# Patient Record
Sex: Female | Born: 1990 | Race: White | Hispanic: No | Marital: Married | Smoking: Never smoker
Health system: Southern US, Community
[De-identification: ages and names within clinical notes are randomized; demographics above are authoritative.]

## PROBLEM LIST (undated history)

## (undated) DIAGNOSIS — R011 Cardiac murmur, unspecified: Secondary | ICD-10-CM

## (undated) DIAGNOSIS — D649 Anemia, unspecified: Secondary | ICD-10-CM

## (undated) DIAGNOSIS — J45909 Unspecified asthma, uncomplicated: Secondary | ICD-10-CM

## (undated) DIAGNOSIS — O09891 Supervision of other high risk pregnancies, first trimester: Secondary | ICD-10-CM

## (undated) DIAGNOSIS — O0990 Supervision of high risk pregnancy, unspecified, unspecified trimester: Secondary | ICD-10-CM

## (undated) DIAGNOSIS — D509 Iron deficiency anemia, unspecified: Secondary | ICD-10-CM

## (undated) HISTORY — DX: Supervision of high risk pregnancy, unspecified, unspecified trimester: O09.90

## (undated) HISTORY — DX: Iron deficiency anemia, unspecified: D50.9

## (undated) HISTORY — PX: OTHER SURGICAL HISTORY: SHX169

## (undated) HISTORY — DX: Supervision of other high risk pregnancies, first trimester: O09.891

---
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
## 2015-10-01 LAB — HM PAP SMEAR: HM Pap smear: NEGATIVE

## 2016-04-07 ENCOUNTER — Emergency Department: Payer: Self-pay

## 2016-04-07 ENCOUNTER — Encounter: Payer: Self-pay | Admitting: *Deleted

## 2016-04-07 ENCOUNTER — Emergency Department
Admission: EM | Admit: 2016-04-07 | Discharge: 2016-04-08 | Disposition: A | Discharge status: Home / Self Care | Payer: Self-pay | Attending: Emergency Medicine | Admitting: Emergency Medicine

## 2016-04-07 DIAGNOSIS — N760 Acute vaginitis: Secondary | ICD-10-CM | POA: Insufficient documentation

## 2016-04-07 DIAGNOSIS — Z30431 Encounter for routine checking of intrauterine contraceptive device: Secondary | ICD-10-CM

## 2016-04-07 DIAGNOSIS — B9689 Other specified bacterial agents as the cause of diseases classified elsewhere: Secondary | ICD-10-CM

## 2016-04-07 LAB — POCT PREGNANCY, URINE: Preg Test, Ur: NEGATIVE

## 2016-04-07 LAB — URINALYSIS COMPLETE WITH MICROSCOPIC (ARMC ONLY)
BACTERIA UA: NONE SEEN
Bilirubin Urine: NEGATIVE
Glucose, UA: NEGATIVE mg/dL
Ketones, ur: NEGATIVE mg/dL
Nitrite: NEGATIVE
PH: 5 (ref 5.0–8.0)
PROTEIN: NEGATIVE mg/dL
Specific Gravity, Urine: 1.015 (ref 1.005–1.030)

## 2016-04-07 LAB — CHLAMYDIA/NGC RT PCR (ARMC ONLY)
Chlamydia Tr: NOT DETECTED
N gonorrhoeae: NOT DETECTED

## 2016-04-07 LAB — WET PREP, GENITAL
Sperm: NONE SEEN
TRICH WET PREP: NONE SEEN
WBC, Wet Prep HPF POC: NONE SEEN
Yeast Wet Prep HPF POC: NONE SEEN

## 2016-04-07 MED ORDER — METRONIDAZOLE 500 MG PO TABS: 500.0000 mg | ORAL_TABLET | Freq: Three times a day (TID) | ORAL | Status: DC

## 2016-04-07 NOTE — Discharge Instructions (Signed)
You were seen in the emergency room for pelvic discomfort. Your IUD strings appear in place today. It is important that you follow up closely with the health department in the next couple of days.  Please return to the emergency room right away if you are to develop a fever, severe nausea, your pain becomes severe or worsens, you are unable to keep food down, begin vomiting any dark or bloody fluid, you develop any dark or bloody stools, feel dehydrated, or other new concerns or symptoms arise.   Bacterial Vaginosis Bacterial vaginosis is a vaginal infection that occurs when the normal balance of bacteria in the vagina is disrupted. It results from an overgrowth of certain bacteria. This is the most common vaginal infection in women of childbearing age. Treatment is important to prevent complications, especially in pregnant women, as it can cause a premature delivery. CAUSES  Bacterial vaginosis is caused by an increase in harmful bacteria that are normally present in smaller amounts in the vagina. Several different kinds of bacteria can cause bacterial vaginosis. However, the reason that the condition develops is not fully understood. RISK FACTORS Certain activities or behaviors can put you at an increased risk of developing bacterial vaginosis, including:  Having a new sex partner or multiple sex partners.  Douching.  Using an intrauterine device (IUD) for contraception. Women do not get bacterial vaginosis from toilet seats, bedding, swimming pools, or contact with objects around them. SIGNS AND SYMPTOMS  Some women with bacterial vaginosis have no signs or symptoms. Common symptoms include:  Grey vaginal discharge.  A fishlike odor with discharge, especially after sexual intercourse.  Itching or burning of the vagina and vulva.  Burning or pain with urination. DIAGNOSIS  Your health care provider will take a medical history and examine the vagina for signs of bacterial vaginosis. A  sample of vaginal fluid may be taken. Your health care provider will look at this sample under a microscope to check for bacteria and abnormal cells. A vaginal pH test may also be done.  TREATMENT  Bacterial vaginosis may be treated with antibiotic medicines. These may be given in the form of a pill or a vaginal cream. A second round of antibiotics may be prescribed if the condition comes back after treatment. Because bacterial vaginosis increases your risk for sexually transmitted diseases, getting treated can help reduce your risk for chlamydia, gonorrhea, HIV, and herpes. HOME CARE INSTRUCTIONS   Only take over-the-counter or prescription medicines as directed by your health care provider.  If antibiotic medicine was prescribed, take it as directed. Make sure you finish it even if you start to feel better.  Tell all sexual partners that you have a vaginal infection. They should see their health care provider and be treated if they have problems, such as a mild rash or itching.  During treatment, it is important that you follow these instructions:  Avoid sexual activity or use condoms correctly.  Do not douche.  Avoid alcohol as directed by your health care provider.  Avoid breastfeeding as directed by your health care provider. SEEK MEDICAL CARE IF:   Your symptoms are not improving after 3 days of treatment.  You have increased discharge or pain.  You have a fever. MAKE SURE YOU:   Understand these instructions.  Will watch your condition.  Will get help right away if you are not doing well or get worse. FOR MORE INFORMATION  Centers for Disease Control and Prevention, Division of STD Prevention: SolutionApps.co.zawww.cdc.gov/std American Sexual  Health Association (ASHA): www.ashastd.org    This information is not intended to replace advice given to you by your health care provider. Make sure you discuss any questions you have with your health care provider.   Document Released: 11/24/2005  Document Revised: 12/15/2014 Document Reviewed: 07/06/2013 Elsevier Interactive Patient Education 2016 [employer].  Intrauterine Device Information An intrauterine device (IUD) is inserted into your uterus to prevent pregnancy. There are two types of IUDs available:   Copper IUD--This type of IUD is wrapped in copper wire and is placed inside the uterus. Copper makes the uterus and fallopian tubes produce a fluid that kills sperm. The copper IUD can stay in place for 10 years.  Hormone IUD--This type of IUD contains the hormone progestin (synthetic progesterone). The hormone thickens the cervical mucus and prevents sperm from entering the uterus. It also thins the uterine lining to prevent implantation of a fertilized egg. The hormone can weaken or kill the sperm that get into the uterus. One type of hormone IUD can stay in place for 5 years, and another type can stay in place for 3 years. Your health care provider will make sure you are a good candidate for a contraceptive IUD. Discuss with your health care provider the possible side effects.  ADVANTAGES OF AN INTRAUTERINE DEVICE  IUDs are highly effective, reversible, long acting, and low maintenance.   There are no estrogen-related side effects.   An IUD can be used when breastfeeding.   IUDs are not associated with weight gain.   The copper IUD works immediately after insertion.   The hormone IUD works right away if inserted within 7 days of your period starting. You will need to use a backup method of birth control for 7 days if the hormone IUD is inserted at any other time in your cycle.  The copper IUD does not interfere with your female hormones.   The hormone IUD can make heavy menstrual periods lighter and decrease cramping.   The hormone IUD can be used for 3 or 5 years.   The copper IUD can be used for 10 years. DISADVANTAGES OF AN INTRAUTERINE DEVICE  The hormone IUD can be associated with irregular bleeding  patterns.   The copper IUD can make your menstrual flow heavier and more painful.   You may experience cramping and vaginal bleeding after insertion.    This information is not intended to replace advice given to you by your health care provider. Make sure you discuss any questions you have with your health care provider.   Document Released: 10/28/2004 Document Revised: 07/27/2013 Document Reviewed: 05/15/2013 Elsevier Interactive Patient Education [employer]

## 2016-04-07 NOTE — ED Notes (Signed)
Pt states IUD strings are longer, noticed yesterday morning.. Pt states she has been having a lot of lower abdominal pain x 1 weeks. Denies vaginal bleeding.

## 2016-04-07 NOTE — ED Provider Notes (Signed)
[facility] Emergency Department Provider Note  ____________________________________________  Time seen: Approximately 9:32 PM  I have reviewed the triage vital signs and the nursing notes.   HISTORY  Chief Complaint Abdominal Pain    HPI Lori Velasquez is a 25 y.o. female reports no significant medical history, no previous pregnancies but does have an IUD that was placed roughly a few months ago at the health department.  Patient reports that for the last few days she's been experiencing a slight uncomfortable feeling in the vaginal area, and when she had her monthly check on her IUD she believes the strings have pulled out further than normal. She is not having any vaginal discharge or bleeding, fevers chills or abdominal pain. She reports a light discomfort in the center of her mid lower pelvis".  Scrubs a very mild discomfort. Somewhat hard to describe located right in the area of her vagina.   No past medical history on file.  There are no active problems to display for this patient.   No past surgical history on file.  Current Outpatient Rx  Name  Route  Sig  Dispense  Refill  . metroNIDAZOLE (FLAGYL) 500 MG tablet   Oral   Take 1 tablet (500 mg total) by mouth 3 (three) times daily.   30 tablet   0     Allergies Review of patient's allergies indicates no known allergies.  No family history on file.  Social History Social History  Substance Use Topics  . Smoking status: Never Smoker   . Smokeless tobacco: Not on file  . Alcohol Use: No    Review of Systems Constitutional: No fever/chills Eyes: No visual changes. ENT: No sore throat. Cardiovascular: Denies chest pain. Respiratory: Denies shortness of breath. Gastrointestinal: No abdominal pain.  No nausea, no vomiting.  No diarrhea.  No constipation. Genitourinary: Negative for dysuria. No trouble urinating. Musculoskeletal: Negative for back pain. Skin: Negative for  rash. Neurological: Negative for headaches, focal weakness or numbness.  10-point ROS otherwise negative.  ____________________________________________   PHYSICAL EXAM:  VITAL SIGNS: ED Triage Vitals  Enc Vitals Group     BP 04/07/16 2041 112/70 mmHg     Pulse Rate 04/07/16 2041 77     Resp --      Temp 04/07/16 2041 98.1 F (36.7 C)     Temp Source 04/07/16 2041 Oral     SpO2 04/07/16 2041 100 %     Weight 04/07/16 2041 152 lb (68.947 kg)     Height 04/07/16 2041  (1.499 m)     Head Cir --      Peak Flow --      Pain Score 04/07/16 2045 8     Pain Loc --      Pain Edu? --      Excl. in GC? --    Constitutional: Alert and oriented. Well appearing and in no acute distress. Eyes: Conjunctivae are normal. PERRL. EOMI. Head: Atraumatic. Nose: No congestion/rhinnorhea. Mouth/Throat: Mucous membranes are moist.  Oropharynx non-erythematous. Neck: No stridor.   Cardiovascular: Normal rate, regular rhythm. Grossly normal heart sounds.  Good peripheral circulation. Respiratory: Normal respiratory effort.  No retractions. Lungs CTAB. Gastrointestinal: Soft and nontender. No distention. No tenderness at McBurney's point. No CVA tenderness. Genitourinary:  Escorted by nurse Florentina Addison. Normal external exam. Speculum exam with IUD strings noted within the cervix, no evidence of protrusion of the device, bleeding, or other complication. There is no cervical motion tenderness. Minimal and  questionable erythema of the vaginal mucosa. Slight abnormal fishy odor is noted. No adnexal tenderness. Musculoskeletal: No lower extremity tenderness nor edema.   Neurologic:  Normal speech and language. No gross focal neurologic deficits are appreciated. Skin:  Skin is warm, dry and intact. No rash noted. Psychiatric: Mood and affect are normal. Speech and behavior are normal.  ____________________________________________   LABS (all labs ordered are listed, but only abnormal results are  displayed)  Labs Reviewed  WET PREP, GENITAL - Abnormal; Notable for the following:    Clue Cells Wet Prep HPF POC PRESENT (*)    All other components within normal limits  URINALYSIS COMPLETEWITH MICROSCOPIC ([facility] ONLY) - Abnormal; Notable for the following:    Color, Urine YELLOW (*)    APPearance HAZY (*)    Hgb urine dipstick 2+ (*)    Leukocytes, UA TRACE (*)    Squamous Epithelial / LPF 0-5 (*)    All other components within normal limits  CHLAMYDIA/NGC RT PCR ([facility] ONLY)  POCT PREGNANCY, URINE   ____________________________________________  EKG   ____________________________________________  RADIOLOGY  Ultrasound pelvis pending at time of sign out ____________________________________________   PROCEDURES  Procedure(s) performed: None  Critical Care performed: No  ____________________________________________   INITIAL IMPRESSION / ASSESSMENT AND PLAN / ED COURSE  Pertinent labs & imaging results that were available during my care of the patient were reviewed by me and considered in my medical decision making (see chart for details).  Patient presents for concerns she is having discomfort to her IUD. She thinks the strings may have pulled out slightly. She denies any abdominal pain, is afebrile, and has very reassuring abdominal exam without discomfort. She does note a somewhat difficult to describe mid pelvic pain that feels like some uncomfortableness around her IUD. She denies any infectious symptoms or discharge.  Pelvic exam demonstrates IUD that appears to be in good position, no cervical motion tenderness, but a slight abnormal odor is noted. No adnexal discomfort.  Based on her complaint of lower abdominal discomfort, wet prep, gonorrhea chlamydia, and also ultrasound of the pelvis to evaluate for possible cystic or gynecologic process including IUD movement was ordered.  Ongoing care including follow-up pelvic ultrasound assigned to Dr.  Zenda AlpersWebster at 11:10 PM. Patient does appear to likely have bacterial vaginosis by exam, and laboratory testing. Provide prescription for Flagyl, and anticipate close follow-up with the health department was careful pelvic/gynecologic return precautions if ultrasound does not demonstrate acute concerning abnormality.   ____________________________________________   FINAL CLINICAL IMPRESSION(S) / ED DIAGNOSES  Final diagnoses:  BV (bacterial vaginosis)  Intrauterine contraceptive device, checking      Sharyn CreamerMark Elson Ulbrich, MD 04/07/16 2326

## 2016-04-07 NOTE — ED Notes (Signed)
Pt returned from U/S via stretcher. 

## 2016-04-07 NOTE — ED Notes (Signed)
Pt reports low abd pain.  Pt states her IUD strings are longer and that her IUD may be coming out.  No vag bleeding or dysuria.  No back pain.  No n/v/d.

## 2016-04-08 DIAGNOSIS — E669 Obesity, unspecified: Secondary | ICD-10-CM | POA: Insufficient documentation

## 2016-04-08 NOTE — ED Notes (Signed)
E-signature not working, Designer, jewelleryt signed on [employer]--signature page that was printed out by this [employer]ity fundraiser

## 2016-04-08 NOTE — ED Provider Notes (Signed)
-----------------------------------------   1:02 AM on 04/08/2016 -----------------------------------------   Blood pressure 114/72, pulse 87, temperature 98.1 F (36.7 C), temperature source Oral, resp. rate 16, height 4\' 11"  (1.499 m), weight 152 lb (68.947 kg), last menstrual period 04/04/2016, SpO2 96 %.  Assuming care from Dr. Fanny BienQuale.  In short, Lori Velasquez is a 25 y.o. female with a chief complaint of Abdominal Pain .  Refer to the original H&P for additional details.  The current plan of care is to follow up the results of the ultrasound and disposition the patient.   Pelvic ultrasound: No explanation for patient's vaginal discomfort specifically the patient's intrauterine device appears appropriately positioned within the endometrial canal, incidentally noted approximately 1.5 cm physiologic left-sided adnexal cyst.  I discussed the results with the patient at this time she has no further questions or concerns. She'll be discharged home to follow-up with the health department.  Rebecka ApleyAllison P Webster, MD 04/08/16 [phone]

## 2016-07-09 DIAGNOSIS — Z87798 Personal history of other (corrected) congenital malformations: Secondary | ICD-10-CM | POA: Insufficient documentation

## 2016-08-13 DIAGNOSIS — IMO0002 Reserved for concepts with insufficient information to code with codable children: Secondary | ICD-10-CM | POA: Insufficient documentation

## 2016-12-11 LAB — HM HIV SCREENING LAB: HM HIV Screening: NEGATIVE

## 2017-08-06 NOTE — Progress Notes (Signed)
08/07/2017   Chief Complaint: Desires prenatal care  Transfer of Care Patient: no  History of Present Illness: Lori Velasquez is a 26 y.o. G2P1001 presenting for prenatal care. Patient's last menstrual period was 06/26/2017.  Estimated Date of Delivery: 04/02/2018.  Positive urine pregnancy test 8/20 and confirmation at health department.  Her periods were: only one menses postpartum She was using no method when she conceived.  She has Positive signs or symptoms of breast tenderness and nausea of pregnancy. She has Negative signs or symptoms of miscarriage or preterm labor She identifies Negative Zika risk factors for her and her partner On any different medications around the time she conceived/early pregnancy: No  History of varicella: No   ROS: A 12-point review of systems was performed and negative, except as stated in the above HPI.  OBGYN History: As per HPI. OB History  Gravida Para Term Preterm AB Living  2 1 1     1   SAB TAB Ectopic Multiple Live Births               # Outcome Date GA Lbr Len/2nd Weight Sex Delivery Anes PTL Lv  2 Current           1 Term 03/10/17    M CS-Unspec         Any issues with any prior pregnancies: no Any prior children are healthy, doing well, without any problems or issues: yes History of pap smears: Yes. Last pap smear 2016. Abnormal: No  History of STIs: Yes, chlamydia in 2006   Past Medical History: No past medical history on file.  Past Surgical History: Past Surgical History:  Procedure Laterality Date  . CESAREAN SECTION      Family History:  No family history on file.  Patient is a twin. She denies any female cancers, bleeding or blood clotting disorders.  She denies any history of intellectual disability, birth defects or genetic disorders in her or the FOB's history  Social History:  Social History   Social History  . Marital status: Single    Spouse name: N/A  . Number of children: N/A  . Years of education: N/A     Occupational History  . Not on file.   Social History Main Topics  . Smoking status: Never Smoker  . Smokeless tobacco: Never Used  . Alcohol use No  . Drug use: No  . Sexual activity: Yes    Birth control/ protection: None   Other Topics Concern  . Not on file   Social History Narrative  . No narrative on file   Any pets in the household: no  Allergy: Allergies  Allergen Reactions  . Peppermint Oil   . Diphenhydramine Hcl     Told she had some reaction as a young child    Current Outpatient Medications: No current outpatient prescriptions on file.   Physical Exam:   BP 120/80   Wt 157 lb (71.2 kg)   LMP 06/26/2017 (LMP Unknown)   BMI 31.71 kg/m  Body mass index is 31.71 kg/m. Constitutional: Well nourished, well developed female in no acute distress.  Neck:  Supple, normal appearance, and no thyromegaly  Cardiovascular: S1, S2 normal, no murmur, rub or gallop, regular rate and rhythm Respiratory:  Clear to auscultation bilateral. Normal respiratory effort Abdomen: positive bowel sounds and no masses, hernias; diffusely non tender to palpation, non distended Breasts: breasts appear normal, no suspicious masses, no skin or nipple changes or axillary nodes. Neuro/Psych:  Normal mood  and affect.  Skin:  Warm and dry.  Lymphatic:  No inguinal lymphadenopathy.   Pelvic exam: is limited by body habitus External genitalia, Bartholin's glands, Urethra, Skene's glands: within normal limits Vagina: within normal limits and with no blood in the vault  Cervix: normal  Uterus:  Unable to palpate fundus on bimanual exam Adnexa:  negative for mass and tenderness  Assessment: Lori Velasquez is a 26 y.o. G2P1001 with an Estimated Date of Delivery: 04/02/2018, presenting for prenatal care.  Plan:  1) Avoid alcoholic beverages. 2) Patient encouraged not to smoke.  3) Discontinue the use of all non-medicinal drugs and chemicals.  4) Take prenatal vitamins daily.  5)  Seatbelt use advised. 6) Nutrition, food safety (fish, cheese advisories, and high nitrite foods) and exercise discussed. 7) Hospital and practice style delivering at [facility] discussed  8) Patient is asked about travel to areas at risk for the Zika virus, and counseled to avoid travel and exposure to mosquitoes or sexual partners who may have themselves been exposed to the virus.  9) Childbirth classes at [facility] advised 10) Genetic Screening, such as with 1st Trimester Screening, cell free fetal DNA, AFP testing, and Ultrasound, as well as with amniocentesis and CVS as appropriate, is discussed with patient. She plans to decline genetic testing this pregnancy.  Problem list reviewed and updated.  ROB and ultrasound in 4 weeks.  Lori Velasquez, CNM [facility], [facility] 08/07/2017  4:39 PM

## 2017-08-07 ENCOUNTER — Encounter: Payer: Self-pay | Admitting: Maternal Newborn

## 2017-08-07 ENCOUNTER — Ambulatory Visit (INDEPENDENT_AMBULATORY_CARE_PROVIDER_SITE_OTHER): Payer: Medicaid Other | Admitting: Maternal Newborn

## 2017-08-07 VITALS — BP 120/80 | Wt 157.0 lb

## 2017-08-07 DIAGNOSIS — O34219 Maternal care for unspecified type scar from previous cesarean delivery: Secondary | ICD-10-CM

## 2017-08-07 DIAGNOSIS — O0990 Supervision of high risk pregnancy, unspecified, unspecified trimester: Secondary | ICD-10-CM | POA: Insufficient documentation

## 2017-08-07 DIAGNOSIS — Z3481 Encounter for supervision of other normal pregnancy, first trimester: Secondary | ICD-10-CM

## 2017-08-07 DIAGNOSIS — O09891 Supervision of other high risk pregnancies, first trimester: Secondary | ICD-10-CM

## 2017-08-07 HISTORY — DX: Supervision of other high risk pregnancies, first trimester: O09.891

## 2017-08-07 HISTORY — DX: Supervision of high risk pregnancy, unspecified, unspecified trimester: O09.90

## 2017-08-08 LAB — RPR+RH+ABO+RUB AB+AB SCR+CB...
Antibody Screen: NEGATIVE
HEMOGLOBIN: 12.3 g/dL (ref 11.1–15.9)
HIV Screen 4th Generation wRfx: NONREACTIVE
Hematocrit: 40 % (ref 34.0–46.6)
Hepatitis B Surface Ag: NEGATIVE
MCH: 23.9 pg — AB (ref 26.6–33.0)
MCHC: 30.8 g/dL — AB (ref 31.5–35.7)
MCV: 78 fL — AB (ref 79–97)
Platelets: 316 10*3/uL (ref 150–379)
RBC: 5.15 x10E6/uL (ref 3.77–5.28)
RDW: 15.1 % (ref 12.3–15.4)
RPR Ser Ql: NONREACTIVE
Rh Factor: POSITIVE
Rubella Antibodies, IGG: <0.9 index — ABNORMAL LOW (ref >0.99)
VARICELLA: 337 {index} (ref >165)
WBC: 7.1 10*3/uL (ref 3.4–10.8)

## 2017-08-09 LAB — URINE CULTURE

## 2017-08-11 ENCOUNTER — Telehealth: Payer: Self-pay | Admitting: Maternal Newborn

## 2017-08-11 ENCOUNTER — Encounter: Payer: Self-pay | Admitting: Maternal Newborn

## 2017-08-11 DIAGNOSIS — R8271 Bacteriuria: Secondary | ICD-10-CM

## 2017-08-11 DIAGNOSIS — Z3481 Encounter for supervision of other normal pregnancy, first trimester: Secondary | ICD-10-CM

## 2017-08-11 LAB — GC/CHLAMYDIA PROBE AMP
Chlamydia trachomatis, NAA: NEGATIVE
NEISSERIA GONORRHOEAE BY PCR: NEGATIVE

## 2017-08-11 MED ORDER — AMOXICILLIN 250 MG/5ML PO SUSR: 500.0000 mg | Freq: Three times a day (TID) | ORAL | 0 refills | Status: AC

## 2017-08-11 MED ORDER — PRENATE PIXIE 10-0.6-0.4-200 MG PO CAPS: 1.0000 | ORAL_CAPSULE | Freq: Every day | ORAL | 3 refills | Status: AC

## 2017-08-11 NOTE — Telephone Encounter (Signed)
Spoke with patient by phone and explained GBS bacteruria and need for amoxicillin Rx due to pregnancy. Patient also requested smaller prenatal vitamins due to nausea. Rx for both sent to pharmacy.

## 2017-09-04 ENCOUNTER — Other Ambulatory Visit: Payer: Self-pay

## 2017-09-04 ENCOUNTER — Encounter: Payer: Self-pay | Admitting: Obstetrics & Gynecology

## 2017-09-07 ENCOUNTER — Ambulatory Visit (INDEPENDENT_AMBULATORY_CARE_PROVIDER_SITE_OTHER): Payer: Medicaid Other

## 2017-09-07 ENCOUNTER — Ambulatory Visit (INDEPENDENT_AMBULATORY_CARE_PROVIDER_SITE_OTHER): Payer: Medicaid Other | Admitting: Obstetrics and Gynecology

## 2017-09-07 VITALS — BP 118/70 | Wt 167.0 lb

## 2017-09-07 DIAGNOSIS — Z3481 Encounter for supervision of other normal pregnancy, first trimester: Secondary | ICD-10-CM

## 2017-09-07 DIAGNOSIS — O34219 Maternal care for unspecified type scar from previous cesarean delivery: Secondary | ICD-10-CM

## 2017-09-07 DIAGNOSIS — O0990 Supervision of high risk pregnancy, unspecified, unspecified trimester: Secondary | ICD-10-CM

## 2017-09-07 DIAGNOSIS — R8271 Bacteriuria: Secondary | ICD-10-CM

## 2017-09-07 DIAGNOSIS — Z362 Encounter for other antenatal screening follow-up: Secondary | ICD-10-CM

## 2017-09-07 DIAGNOSIS — O09891 Supervision of other high risk pregnancies, first trimester: Secondary | ICD-10-CM

## 2017-09-07 NOTE — Progress Notes (Signed)
  Routine Prenatal Care Visit  Subjective  Lori Velasquez is a 26 y.o. G2P1001 at [redacted]w[redacted]d being seen today for ongoing prenatal care.  She is currently monitored for the following issues for this high-risk pregnancy and has History of cesarean delivery, currently pregnant; Short interval between pregnancies affecting pregnancy in first trimester, antepartum; Supervision of high risk pregnancy, antepartum; and GBS bacteriuria on her problem list.  ----------------------------------------------------------------------------------- Patient reports no complaints.    . Vag. Bleeding: None.   . Denies leaking of fluid.  U/S confirms EDD.  Denies nausea. Again declines genetic screening testing ----------------------------------------------------------------------------------- The following portions of the patient's history were reviewed and updated as appropriate: allergies, current medications, past family history, past medical history, past social history, past surgical history and problem list. Problem list updated.   Objective  Blood pressure 118/70, weight 167 lb (75.8 kg), last menstrual period 06/26/2017. Pregravid weight 157 lb (71.2 kg) Total Weight Gain 10 lb (4.536 kg) Urinalysis: Urine Protein: Negative Urine Glucose: Negative  Fetal Status: Fetal Heart Rate (bpm): present         General:  Alert, oriented and cooperative. Patient is in no acute distress.  Skin: Skin is warm and dry. No rash noted.   Cardiovascular: Normal heart rate noted  Respiratory: Normal respiratory effort, no problems with respiration noted  Abdomen: Soft, gravid, appropriate for gestational age. Pain/Pressure: Absent     Pelvic:  Cervical exam deferred        Extremities: Normal range of motion.     Mental Status: Normal mood and affect. Normal behavior. Normal judgment and thought content.   Assessment   26 y.o. G2P1001 at [redacted]w[redacted]d by  04/02/2018, by Last Menstrual Period presenting for routine prenatal  visit  Plan   pregnancy 2 Problems (from 06/26/17 to present)    Problem Noted Resolved   GBS bacteriuria - treated w amoxicillin 08/11/2017 by Oswaldo Conroy, CNM No   History of cesarean delivery, currently pregnant 08/07/2017 by Oswaldo Conroy, CNM No   Short interval between pregnancies affecting pregnancy in first trimester, antepartum 08/07/2017 by Oswaldo Conroy, CNM No   Supervision of high risk pregnancy, antepartum 08/07/2017 by Oswaldo Conroy, CNM No   Overview Addendum 09/07/2017  3:56 PM by Conard Novak, MD    Clinic [facility] Prenatal Labs  Dating LMP Blood type: A/Positive/-- (08/31 1557)   Genetic Screen Declines Antibody:Negative (08/31 1557)  Anatomic [place]  Rubella:  Varicella:    GTT Early:               Third trimester:  RPR: Non Reactive (08/31 1557)   Rhogam  HBsAg: Negative (08/31 1557)   TDaP vaccine                       Flu Shot: HIV:   neg  Baby Food                                GBS:   Contraception  Pap: Normal in 2016  CBB     CS/VBAC    Support Person                Please refer to After Visit Summary for other counseling recommendations.   Return in about 4 weeks (around 10/05/2017) for Routine Prenatal Appointment.  Thomasene Mohair, MD  09/07/2017 4:03 PM

## 2017-10-04 ENCOUNTER — Encounter (HOSPITAL_COMMUNITY): Payer: Self-pay

## 2017-10-05 ENCOUNTER — Ambulatory Visit (INDEPENDENT_AMBULATORY_CARE_PROVIDER_SITE_OTHER): Payer: Medicaid Other | Admitting: Obstetrics and Gynecology

## 2017-10-05 VITALS — BP 122/74 | Wt 168.0 lb

## 2017-10-05 DIAGNOSIS — R8271 Bacteriuria: Secondary | ICD-10-CM

## 2017-10-05 DIAGNOSIS — O09891 Supervision of other high risk pregnancies, first trimester: Secondary | ICD-10-CM

## 2017-10-05 DIAGNOSIS — O34219 Maternal care for unspecified type scar from previous cesarean delivery: Secondary | ICD-10-CM

## 2017-10-05 DIAGNOSIS — Z3A14 14 weeks gestation of pregnancy: Secondary | ICD-10-CM

## 2017-10-05 DIAGNOSIS — O0990 Supervision of high risk pregnancy, unspecified, unspecified trimester: Secondary | ICD-10-CM

## 2017-10-05 NOTE — Progress Notes (Signed)
  Routine Prenatal Care Visit  Subjective  Lori Velasquez is a 26 y.o. G2P1001 at 6321w3d being seen today for ongoing prenatal care.  She is currently monitored for the following issues for this high-risk pregnancy and has History of cesarean delivery, currently pregnant; Short interval between pregnancies affecting pregnancy in first trimester, antepartum; Supervision of high risk pregnancy, antepartum; and GBS bacteriuria on her problem list.  ----------------------------------------------------------------------------------- Patient reports no complaints.    . Vag. Bleeding: None.   . Denies leaking of fluid.  ----------------------------------------------------------------------------------- The following portions of the patient's history were reviewed and updated as appropriate: allergies, current medications, past family history, past medical history, past social history, past surgical history and problem list. Problem list updated.   Objective  Blood pressure 122/74, weight 168 lb (76.2 kg), last menstrual period 06/26/2017. Pregravid weight 157 lb (71.2 kg) Total Weight Gain 11 lb (4.99 kg) Urinalysis: Urine Protein: Negative Urine Glucose: Negative  Fetal Status: Fetal Heart Rate (bpm): 154         General:  Alert, oriented and cooperative. Patient is in no acute distress.  Skin: Skin is warm and dry. No rash noted.   Cardiovascular: Normal heart rate noted  Respiratory: Normal respiratory effort, no problems with respiration noted  Abdomen: Soft, gravid, appropriate for gestational age. Pain/Pressure: Absent     Pelvic:  Cervical exam deferred        Extremities: Normal range of motion.     Mental Status: Normal mood and affect. Normal behavior. Normal judgment and thought content.   Assessment   26 y.o. G2P1001 at 1721w3d by  04/02/2018, by Last Menstrual Period presenting for routine prenatal visit  Plan   pregnancy 2 Problems (from 06/26/17 to present)    Problem Noted  Resolved   GBS bacteriuria 08/11/2017 by Oswaldo ConroySchmid, Jacelyn Y, CNM No   History of cesarean delivery, currently pregnant 08/07/2017 by Oswaldo ConroySchmid, Jacelyn Y, CNM No   Overview Signed 10/05/2017  4:16 PM by Conard NovakJackson, Bevin Das D, MD    [ ]  Desires repeat - schedule early 3rd trim      Short interval between pregnancies affecting pregnancy in first trimester, antepartum 08/07/2017 by Oswaldo ConroySchmid, Jacelyn Y, CNM No   Supervision of high risk pregnancy, antepartum 08/07/2017 by Oswaldo ConroySchmid, Jacelyn Y, CNM No   Overview Addendum 09/07/2017  3:56 PM by Conard NovakJackson, Cabell Lazenby D, MD    [facility] Prenatal Labs  Dating LMP Blood type: A/Positive/-- (08/31 1557)   Genetic Screen Declines Antibody:Negative (08/31 1557)  Anatomic US  Rubella:  Varicella:    GTT Early:               Third trimester:  RPR: Non Reactive (08/31 1557)   Rhogam  HBsAg: Negative (08/31 1557)   TDaP vaccine                       Flu Shot: HIV:   neg  Baby Food                                GBS:   Contraception  Pap: Normal in 2016  CBB     CS/VBAC    Support Person                 Please refer to After Visit Summary for other counseling recommendations.   No Follow-up on file.  Thomasene MohairStephen Coston Mandato, MD  10/05/2017 4:17 PM

## 2017-11-02 ENCOUNTER — Ambulatory Visit (INDEPENDENT_AMBULATORY_CARE_PROVIDER_SITE_OTHER): Payer: Medicaid Other

## 2017-11-02 ENCOUNTER — Ambulatory Visit (INDEPENDENT_AMBULATORY_CARE_PROVIDER_SITE_OTHER): Payer: Medicaid Other | Admitting: Advanced Practice Midwife

## 2017-11-02 ENCOUNTER — Encounter: Payer: Self-pay | Admitting: Advanced Practice Midwife

## 2017-11-02 VITALS — BP 120/80 | Wt 161.0 lb

## 2017-11-02 DIAGNOSIS — O0990 Supervision of high risk pregnancy, unspecified, unspecified trimester: Secondary | ICD-10-CM

## 2017-11-02 DIAGNOSIS — Z3A18 18 weeks gestation of pregnancy: Secondary | ICD-10-CM

## 2017-11-02 NOTE — Progress Notes (Signed)
C/o vomitting,  Ketones neg.rj

## 2017-11-02 NOTE — Progress Notes (Signed)
Routine Prenatal Care Visit  Subjective  Lori Velasquez is a 26 y.o. G2P1001 at 6879w3d being seen today for ongoing prenatal care.  She is currently monitored for the following issues for this high-risk pregnancy and has History of cesarean delivery, currently pregnant; Short interval between pregnancies affecting pregnancy in first trimester, antepartum; Supervision of high risk pregnancy, antepartum; and GBS bacteriuria on their problem list.  ----------------------------------------------------------------------------------- Patient reports nausea and has had vomiting every few days. She denies symptoms of illness, no fever, HA, SOB, congestion, diarrhea. She had an episode of light headedness on Friday of last week while out shopping.   Denies contractions. Denies vaginal bleeding. Denies leaking of fluid.   ----------------------------------------------------------------------------------- The following portions of the patient's history were reviewed and updated as appropriate: allergies, current medications, past family history, past medical history, past social history, past surgical history and problem list. Problem list updated.   Objective  Blood pressure 120/80, weight 161 lb (73 kg), last menstrual period 06/26/2017. Pregravid weight 157 lb (71.2 kg) Total Weight Gain 4 lb (1.814 kg) Urinalysis: Urine Protein: Negative Urine Glucose: Negative  Fetal Status: Anatomy scan today is complete.   General:  Alert, oriented and cooperative. Patient is in no acute distress.  Skin: Skin is warm and dry. No rash noted.   Cardiovascular: Normal heart rate noted  Respiratory: Normal respiratory effort, no problems with respiration noted  Abdomen: Soft, gravid, appropriate for gestational age.       Pelvic:  Cervical exam deferred        Extremities: Normal range of motion.     Mental Status: Normal mood and affect. Normal behavior. Normal judgment and thought content.   Assessment   26  y.o. G2P1001 at 7879w3d by  04/02/2018, by Last Menstrual Period presenting for routine prenatal visit  Plan   pregnancy 2 Problems (from 06/26/17 to present)    Problem Noted Resolved   GBS bacteriuria 08/11/2017 by Oswaldo ConroySchmid, Jacelyn Y, CNM No   History of cesarean delivery, currently pregnant 08/07/2017 by Oswaldo ConroySchmid, Jacelyn Y, CNM No   Overview Signed 10/05/2017  4:16 PM by Conard NovakJackson, Stephen D, MD    [ ]  Desires repeat - schedule early 3rd trim      Short interval between pregnancies affecting pregnancy in first trimester, antepartum 08/07/2017 by Oswaldo ConroySchmid, Jacelyn Y, CNM No   Supervision of high risk pregnancy, antepartum 08/07/2017 by Oswaldo ConroySchmid, Jacelyn Y, CNM No   Overview Addendum 09/07/2017  3:56 PM by Conard NovakJackson, Stephen D, MD    [facility] Prenatal Labs  Dating LMP Blood type: A/Positive/-- (08/31 1557)   Genetic Screen Declines Antibody:Negative (08/31 1557)  Anatomic US  Rubella:  Varicella:    GTT Early:               Third trimester:  RPR: Non Reactive (08/31 1557)   Rhogam  HBsAg: Negative (08/31 1557)   TDaP vaccine                       Flu Shot: HIV:   neg  Baby Food                                GBS:   Contraception  Pap: Normal in 2016  CBB     CS/VBAC    Support Person                  Preterm labor symptoms  and general obstetric precautions including but not limited to vaginal bleeding, contractions, leaking of fluid and fetal movement were reviewed in detail with the patient.  Encouraged to stay hydrated and eat adequate protein.    Return in about 4 weeks (around 11/30/2017) for rob.  Tresea MallJane Shandell Giovanni, CNM  11/02/2017 3:17 PM

## 2017-12-03 ENCOUNTER — Ambulatory Visit (INDEPENDENT_AMBULATORY_CARE_PROVIDER_SITE_OTHER): Payer: Medicaid Other | Admitting: Obstetrics and Gynecology

## 2017-12-03 VITALS — BP 118/76 | Wt 165.0 lb

## 2017-12-03 DIAGNOSIS — R8271 Bacteriuria: Secondary | ICD-10-CM

## 2017-12-03 DIAGNOSIS — O0990 Supervision of high risk pregnancy, unspecified, unspecified trimester: Secondary | ICD-10-CM

## 2017-12-03 DIAGNOSIS — O09891 Supervision of other high risk pregnancies, first trimester: Secondary | ICD-10-CM

## 2017-12-03 DIAGNOSIS — Z3A22 22 weeks gestation of pregnancy: Secondary | ICD-10-CM

## 2017-12-03 DIAGNOSIS — O34219 Maternal care for unspecified type scar from previous cesarean delivery: Secondary | ICD-10-CM

## 2017-12-03 NOTE — Progress Notes (Signed)
Routine Prenatal Care Visit  Subjective  Lori Velasquez is a 26 y.o. G2P1001 at 7671w6d being seen today for ongoing prenatal care.  She is currently monitored for the following issues for this high-risk pregnancy and has History of cesarean delivery, currently pregnant; Short interval between pregnancies affecting pregnancy in first trimester, antepartum; Supervision of high risk pregnancy, antepartum; and GBS bacteriuria on their problem list.  ----------------------------------------------------------------------------------- Patient reports no complaints.   Contractions: Not present. Vag. Bleeding: None.  Movement: Present. Denies leaking of fluid.  ----------------------------------------------------------------------------------- The following portions of the patient's history were reviewed and updated as appropriate: allergies, current medications, past family history, past medical history, past social history, past surgical history and problem list. Problem list updated.   Objective  Blood pressure 118/76, weight 165 lb (74.8 kg), last menstrual period 06/26/2017. Pregravid weight 157 lb (71.2 kg) Total Weight Gain 8 lb (3.629 kg) Urinalysis: Urine Protein: Negative Urine Glucose: Negative  Fetal Status: Fetal Heart Rate (bpm): 151   Movement: Present     General:  Alert, oriented and cooperative. Patient is in no acute distress.  Skin: Skin is warm and dry. No rash noted.   Cardiovascular: Normal heart rate noted  Respiratory: Normal respiratory effort, no problems with respiration noted  Abdomen: Soft, gravid, appropriate for gestational age. Pain/Pressure: Absent     Pelvic:  Cervical exam deferred        Extremities: Normal range of motion.     ental Status: Normal mood and affect. Normal behavior. Normal judgment and thought content.     Assessment   26 y.o. G2P1001 at 4771w6d by  04/02/2018, by Last Menstrual Period presenting for routine prenatal visit  Plan    pregnancy 2 Problems (from 06/26/17 to present)    Problem Noted Resolved   GBS bacteriuria 08/11/2017 by Oswaldo ConroySchmid, Jacelyn Y, CNM No   History of cesarean delivery, currently pregnant 08/07/2017 by Oswaldo ConroySchmid, Jacelyn Y, CNM No   Overview Signed 10/05/2017  4:16 PM by Conard NovakJackson, Stephen D, MD    [ ]  Desires repeat - schedule early 3rd trim      Short interval between pregnancies affecting pregnancy in first trimester, antepartum 08/07/2017 by Oswaldo ConroySchmid, Jacelyn Y, CNM No   Supervision of high risk pregnancy, antepartum 08/07/2017 by Oswaldo ConroySchmid, Jacelyn Y, CNM No   Overview Addendum 09/07/2017  3:56 PM by Conard NovakJackson, Stephen D, MD    [facility] Prenatal Labs  Dating LMP Blood type: A/Positive/-- (08/31 1557)   Genetic Screen Declines Antibody:Negative (08/31 1557)  Anatomic US  Rubella:  Varicella:    GTT Early:               Third trimester:  RPR: Non Reactive (08/31 1557)   Rhogam  HBsAg: Negative (08/31 1557)   TDaP vaccine                       Flu Shot: HIV:   neg  Baby Food                                GBS:   Contraception  Pap: Normal in 2016  CBB     CS/VBAC    Support Person                  Preterm labor symptoms and general obstetric precautions including but not limited to vaginal bleeding, contractions, leaking of fluid and fetal movement were  reviewed in detail with the patient. Please refer to After Visit Summary for other counseling recommendations.   Return in about 2 weeks (around 12/17/2017).

## 2017-12-17 ENCOUNTER — Encounter: Payer: Self-pay | Admitting: Obstetrics and Gynecology

## 2017-12-17 ENCOUNTER — Ambulatory Visit (INDEPENDENT_AMBULATORY_CARE_PROVIDER_SITE_OTHER): Payer: Medicaid Other | Admitting: Obstetrics and Gynecology

## 2017-12-17 VITALS — BP 122/70 | Wt 168.0 lb

## 2017-12-17 DIAGNOSIS — O34219 Maternal care for unspecified type scar from previous cesarean delivery: Secondary | ICD-10-CM

## 2017-12-17 DIAGNOSIS — O0990 Supervision of high risk pregnancy, unspecified, unspecified trimester: Secondary | ICD-10-CM

## 2017-12-17 DIAGNOSIS — Z113 Encounter for screening for infections with a predominantly sexual mode of transmission: Secondary | ICD-10-CM

## 2017-12-17 DIAGNOSIS — O09891 Supervision of other high risk pregnancies, first trimester: Secondary | ICD-10-CM

## 2017-12-17 DIAGNOSIS — Z3A24 24 weeks gestation of pregnancy: Secondary | ICD-10-CM

## 2017-12-17 DIAGNOSIS — Z131 Encounter for screening for diabetes mellitus: Secondary | ICD-10-CM

## 2017-12-17 NOTE — Progress Notes (Signed)
Routine Prenatal Care Visit  Subjective  Lori Velasquez is a 27 y.o. G2P1001 at [redacted]w[redacted]d being seen today for ongoing prenatal care.  She is currently monitored for the following issues for this high-risk pregnancy and has History of cesarean delivery, currently pregnant; Short interval between pregnancies affecting pregnancy in first trimester, antepartum; Supervision of high risk pregnancy, antepartum; and GBS bacteriuria on their problem list.  ----------------------------------------------------------------------------------- Patient reports no complaints.   Contractions: Not present. Vag. Bleeding: None.  Movement: Present. Denies leaking of fluid.  ----------------------------------------------------------------------------------- The following portions of the patient's history were reviewed and updated as appropriate: allergies, current medications, past family history, past medical history, past social history, past surgical history and problem list. Problem list updated.   Objective  Blood pressure 122/70, weight 168 lb (76.2 kg), last menstrual period 06/26/2017. Pregravid weight 157 lb (71.2 kg) Total Weight Gain 11 lb (4.99 kg) Urinalysis:      Fetal Status: Fetal Heart Rate (bpm): 135 Fundal Height: 25 cm Movement: Present     General:  Alert, oriented and cooperative. Patient is in no acute distress.  Skin: Skin is warm and dry. No rash noted.   Cardiovascular: Normal heart rate noted  Respiratory: Normal respiratory effort, no problems with respiration noted  Abdomen: Soft, gravid, appropriate for gestational age. Pain/Pressure: Absent     Pelvic:  Cervical exam deferred        Extremities: Normal range of motion.     Mental Status: Normal mood and affect. Normal behavior. Normal judgment and thought content.   Assessment   27 y.o. G2P1001 at [redacted]w[redacted]d by  04/02/2018, by Last Menstrual Period presenting for routine prenatal visit  Plan   pregnancy 2 Problems (from 06/26/17  to present)    Problem Noted Resolved   GBS bacteriuria 08/11/2017 by Oswaldo Conroy, CNM No   History of cesarean delivery, currently pregnant 08/07/2017 by Oswaldo Conroy, CNM No   Overview Signed 10/05/2017  4:16 PM by Conard Novak, MD    [ ]  Desires repeat - schedule early 3rd trim      Short interval between pregnancies affecting pregnancy in first trimester, antepartum 08/07/2017 by Oswaldo Conroy, CNM No   Supervision of high risk pregnancy, antepartum 08/07/2017 by Oswaldo Conroy, CNM No   Overview Addendum 09/07/2017  3:56 PM by Conard Novak, MD    [facility] Prenatal Labs  Dating LMP Blood type: A/Positive/-- (08/31 1557)   Genetic Screen Declines Antibody:Negative (08/31 1557)  Anatomic [place]  Rubella:  Varicella:    GTT Early:               Third trimester:  RPR: Non Reactive (08/31 1557)   Rhogam  HBsAg: Negative (08/31 1557)   TDaP vaccine                       Flu Shot: HIV:   neg  Baby Food                                GBS:   Contraception  Pap: Normal in 2016  CBB     CS/VBAC    Support Person                  Preterm labor symptoms and general obstetric precautions including but not limited to vaginal bleeding, contractions, leaking of fluid and fetal movement were reviewed in detail with  the patient. Please refer to After Visit Summary for other counseling recommendations.   Return in about 3 weeks (around 01/07/2018) for schedule 28 weeks labs and routine prenatal.  Thomasene MohairStephen Debera Sterba, MD  12/17/2017 4:33 PM

## 2018-01-07 ENCOUNTER — Ambulatory Visit (INDEPENDENT_AMBULATORY_CARE_PROVIDER_SITE_OTHER): Payer: Medicaid Other | Admitting: Maternal Newborn

## 2018-01-07 ENCOUNTER — Other Ambulatory Visit: Payer: Medicaid Other

## 2018-01-07 ENCOUNTER — Encounter: Payer: Self-pay | Admitting: Maternal Newborn

## 2018-01-07 VITALS — BP 120/70 | Wt 170.0 lb

## 2018-01-07 DIAGNOSIS — Z113 Encounter for screening for infections with a predominantly sexual mode of transmission: Secondary | ICD-10-CM

## 2018-01-07 DIAGNOSIS — O0990 Supervision of high risk pregnancy, unspecified, unspecified trimester: Secondary | ICD-10-CM

## 2018-01-07 DIAGNOSIS — Z131 Encounter for screening for diabetes mellitus: Secondary | ICD-10-CM

## 2018-01-07 DIAGNOSIS — Z3A27 27 weeks gestation of pregnancy: Secondary | ICD-10-CM

## 2018-01-07 NOTE — Progress Notes (Signed)
No concerns.rj 

## 2018-01-07 NOTE — Progress Notes (Signed)
Routine Prenatal Care Visit  Subjective  Lori Velasquez is a 27 y.o. G2P1001 at 2842w6d being seen today for ongoing prenatal care.  She is currently monitored for the following issues for this high-risk pregnancy and has History of cesarean delivery, currently pregnant; Short interval between pregnancies affecting pregnancy in first trimester, antepartum; Supervision of high risk pregnancy, antepartum; and GBS bacteriuria on their problem list.  ----------------------------------------------------------------------------------- Patient reports no complaints.   Contractions: Not present. Vag. Bleeding: None.  Movement: Present. Denies leaking of fluid.  ----------------------------------------------------------------------------------- The following portions of the patient's history were reviewed and updated as appropriate: allergies, current medications, past family history, past medical history, past social history, past surgical history and problem list. Problem list updated.   Objective  Blood pressure 120/70, weight 170 lb (77.1 kg), last menstrual period 06/26/2017. Pregravid weight 157 lb (71.2 kg) Total Weight Gain 13 lb (5.897 kg) Urinalysis:      Fetal Status: Fetal Heart Rate (bpm): 144 Fundal Height: 28 cm Movement: Present     General:  Alert, oriented and cooperative. Patient is in no acute distress.  Skin: Skin is warm and dry. No rash noted.   Cardiovascular: Normal heart rate noted  Respiratory: Normal respiratory effort, no problems with respiration noted  Abdomen: Soft, gravid, appropriate for gestational age. Pain/Pressure: Absent     Pelvic:  Cervical exam deferred        Extremities: Normal range of motion.     Mental Status: Normal mood and affect. Normal behavior. Normal judgment and thought content.     Assessment   26 y.o. G2P1001 at 3042w6d, EDD 04/02/2018 by Last Menstrual Period presenting for routine prenatal visit.  Plan   pregnancy 2 Problems (from  06/26/17 to present)    Problem Noted Resolved   GBS bacteriuria 08/11/2017 by Oswaldo ConroySchmid, Jacelyn Y, CNM No   History of cesarean delivery, currently pregnant 08/07/2017 by Oswaldo ConroySchmid, Jacelyn Y, CNM No   Overview Signed 10/05/2017  4:16 PM by Conard NovakJackson, Stephen D, MD    [ ]  Desires repeat - schedule early 3rd trim      Short interval between pregnancies affecting pregnancy in first trimester, antepartum 08/07/2017 by Oswaldo ConroySchmid, Jacelyn Y, CNM No   Supervision of high risk pregnancy, antepartum 08/07/2017 by Oswaldo ConroySchmid, Jacelyn Y, CNM No   Overview Addendum 09/07/2017  3:56 PM by Conard NovakJackson, Stephen D, MD    [facility] Prenatal Labs  Dating LMP Blood type: A/Positive/-- (08/31 1557)   Genetic Screen Declines Antibody:Negative (08/31 1557)  Anatomic US  Rubella:  Varicella:    GTT Early:               Third trimester:  RPR: Non Reactive (08/31 1557)   Rhogam  HBsAg: Negative (08/31 1557)   TDaP vaccine                       Flu Shot: HIV:   neg  Baby Food                                GBS:   Contraception  Pap: Normal in 2016  CBB     CS/VBAC    Support Person               Patient requests scheduling for Cesarean section on 03/26/18 at 39 weeks, will check availability of date and confirm or notify patient about options if desired date  is not available.  Preterm labor symptoms and general obstetric precautions including but not limited to vaginal bleeding, contractions, leaking of fluid and fetal movement were reviewed in detail with the patient.  Return in about 2 weeks (around 01/21/2018) for ROB.  Marcelyn Bruins, CNM 01/07/2018  4:22 PM

## 2018-01-08 LAB — 28 WEEK RH+PANEL
BASOS ABS: 0 10*3/uL (ref 0.0–0.2)
Basos: 0 %
EOS (ABSOLUTE): 0 10*3/uL (ref 0.0–0.4)
EOS: 0 %
GESTATIONAL DIABETES SCREEN: 105 mg/dL (ref 65–139)
HEMATOCRIT: 29.9 % — AB (ref 34.0–46.6)
HIV Screen 4th Generation wRfx: NONREACTIVE
Hemoglobin: 8.9 g/dL — ABNORMAL LOW (ref 11.1–15.9)
Immature Grans (Abs): 0 10*3/uL (ref 0.0–0.1)
Immature Granulocytes: 0 %
LYMPHS ABS: 1.7 10*3/uL (ref 0.7–3.1)
Lymphs: 15 %
MCH: 21.7 pg — ABNORMAL LOW (ref 26.6–33.0)
MCHC: 29.8 g/dL — ABNORMAL LOW (ref 31.5–35.7)
MCV: 73 fL — AB (ref 79–97)
Monocytes Absolute: 0.7 10*3/uL (ref 0.1–0.9)
Monocytes: 6 %
Neutrophils Absolute: 9 10*3/uL — ABNORMAL HIGH (ref 1.4–7.0)
Neutrophils: 79 %
PLATELETS: 247 10*3/uL (ref 150–379)
RBC: 4.1 x10E6/uL (ref 3.77–5.28)
RDW: 15.7 % — AB (ref 12.3–15.4)
RPR Ser Ql: NONREACTIVE
WBC: 11.5 10*3/uL — AB (ref 3.4–10.8)

## 2018-01-18 ENCOUNTER — Telehealth: Payer: Self-pay | Admitting: Obstetrics and Gynecology

## 2018-01-18 NOTE — Telephone Encounter (Signed)
Patient is aware that there is no one in the OR on 03/30/18 and per my conversation with Dr. Jerene PitchSchuman, the c/s date has been moved to 04/01/18. [facility], Pre-admit Testing afterwards, and OR on 04/01/18.

## 2018-01-18 NOTE — Telephone Encounter (Signed)
-----   Message from Oswaldo ConroyJacelyn Y Schmid, CNM sent at 01/14/2018 11:35 AM EST ----- Regarding: Schedule Cesarean Surgery Booking Request Patient Full Name:   MRN: [id number]  DOB: December 11, 1990  Surgeon:  Adelene Idlerhristanna Schuman, MD Requested Surgery Date and Time: 03/30/2018 Primary Diagnosis AND Code: Repeat Cesarean Secondary Diagnosis and Code:  Surgical Procedure: Cesarean Section L&D Notification: Yes Admission Status: surgery admit Length of Surgery: 1 hour Special Case Needs: On Q  H&P: yes (date) Phone Interview???: no Interpreter: Language:  Medical Clearance: no Special Scheduling Instructions:

## 2018-01-21 ENCOUNTER — Ambulatory Visit (INDEPENDENT_AMBULATORY_CARE_PROVIDER_SITE_OTHER): Payer: Medicaid Other | Admitting: Advanced Practice Midwife

## 2018-01-21 ENCOUNTER — Encounter: Payer: Self-pay | Admitting: Advanced Practice Midwife

## 2018-01-21 VITALS — BP 118/74 | Wt 170.0 lb

## 2018-01-21 DIAGNOSIS — Z3A29 29 weeks gestation of pregnancy: Secondary | ICD-10-CM

## 2018-01-21 NOTE — Progress Notes (Signed)
Routine Prenatal Care Visit  Subjective  Lori Velasquez is a 27 y.o. G2P1001 at [redacted]w[redacted]d being seen today for ongoing prenatal care.  She is currently monitored for the following issues for this high-risk pregnancy and has History of cesarean delivery, currently pregnant; Short interval between pregnancies affecting pregnancy in first trimester, antepartum; Supervision of high risk pregnancy, antepartum; and GBS bacteriuria on their problem list.  ----------------------------------------------------------------------------------- Patient reports no complaints.  She is taking iron supplement. Contractions: Not present. Vag. Bleeding: None.  Movement: Present. Denies leaking of fluid.  ----------------------------------------------------------------------------------- The following portions of the patient's history were reviewed and updated as appropriate: allergies, current medications, past family history, past medical history, past social history, past surgical history and problem list. Problem list updated.   Objective  Blood pressure 118/74, weight 170 lb (77.1 kg), last menstrual period 06/26/2017. Pregravid weight 157 lb (71.2 kg) Total Weight Gain 13 lb (5.897 kg) Urinalysis: Urine Protein: Negative Urine Glucose: Negative  Fetal Status: Fetal Heart Rate (bpm): 150 Fundal Height: 30 cm Movement: Present     General:  Alert, oriented and cooperative. Patient is in no acute distress.  Skin: Skin is warm and dry. No rash noted.   Cardiovascular: Normal heart rate noted  Respiratory: Normal respiratory effort, no problems with respiration noted  Abdomen: Soft, gravid, appropriate for gestational age. Pain/Pressure: Absent     Pelvic:  Cervical exam deferred        Extremities: Normal range of motion.  Edema: None  Mental Status: Normal mood and affect. Normal behavior. Normal judgment and thought content.   Assessment   27 y.o. G2P1001 at [redacted]w[redacted]d by  04/02/2018, by Last Menstrual Period  presenting for routine prenatal visit  Plan   pregnancy 2 Problems (from 06/26/17 to present)    Problem Noted Resolved   GBS bacteriuria 08/11/2017 by Oswaldo Conroy, CNM No   History of cesarean delivery, currently pregnant 08/07/2017 by Oswaldo Conroy, CNM No   Overview Signed 10/05/2017  4:16 PM by Conard Novak, MD    [ ]  Desires repeat - schedule early 3rd trim      Short interval between pregnancies affecting pregnancy in first trimester, antepartum 08/07/2017 by Oswaldo Conroy, CNM No   Supervision of high risk pregnancy, antepartum 08/07/2017 by Oswaldo Conroy, CNM No   Overview Addendum 01/08/2018  6:33 PM by Conard Novak, MD    [facility] Prenatal Labs  Dating LMP Blood type: A/Positive/-- (08/31 1557)   Genetic Screen Declines Antibody:Negative (08/31 1557)  Anatomic [place]  Rubella:  Varicella:    GTT Early:               Third trimester: 105 RPR: Non Reactive (08/31 1557)   Rhogam  HBsAg: Negative (08/31 1557)   TDaP vaccine                       Flu Shot: HIV:   neg  Baby Food                                GBS:   Contraception  Pap: Normal in 2016  CBB     CS/VBAC    Support Person                  Preterm labor symptoms and general obstetric precautions including but not limited to vaginal bleeding, contractions, leaking of fluid and fetal  movement were reviewed in detail with the patient. Please refer to After Visit Summary for other counseling recommendations.   Return in about 2 weeks (around 02/04/2018) for rob.  Tresea MallJane Hind Chesler, CNM  01/21/2018 4:17 PM

## 2018-01-21 NOTE — Progress Notes (Signed)
No vb. No lof.  

## 2018-01-21 NOTE — Patient Instructions (Signed)
Third Trimester of Pregnancy The third trimester is from week 28 through week 40 (months 7 through 9). The third trimester is a time when the unborn baby (fetus) is growing rapidly. At the end of the ninth month, the fetus is about 20 inches in length and weighs 6-10 pounds. Body changes during your third trimester Your body will continue to go through many changes during pregnancy. The changes vary from woman to woman. During the third trimester:  Your weight will continue to increase. You can expect to gain 25-35 pounds (11-16 kg) by the end of the pregnancy.  You may begin to get stretch marks on your hips, abdomen, and breasts.  You may urinate more often because the fetus is moving lower into your pelvis and pressing on your bladder.  You may develop or continue to have heartburn. This is caused by increased hormones that slow down muscles in the digestive tract.  You may develop or continue to have constipation because increased hormones slow digestion and cause the muscles that push waste through your intestines to relax.  You may develop hemorrhoids. These are swollen veins (varicose veins) in the rectum that can itch or be painful.  You may develop swollen, bulging veins (varicose veins) in your legs.  You may have increased body aches in the pelvis, back, or thighs. This is due to weight gain and increased hormones that are relaxing your joints.  You may have changes in your hair. These can include thickening of your hair, rapid growth, and changes in texture. Some women also have hair loss during or after pregnancy, or hair that feels dry or thin. Your hair will most likely return to normal after your baby is born.  Your breasts will continue to grow and they will continue to become tender. A yellow fluid (colostrum) may leak from your breasts. This is the first milk you are producing for your baby.  Your belly button may stick out.  You may notice more swelling in your hands,  face, or ankles.  You may have increased tingling or numbness in your hands, arms, and legs. The skin on your belly may also feel numb.  You may feel short of breath because of your expanding uterus.  You may have more problems sleeping. This can be caused by the size of your belly, increased need to urinate, and an increase in your body's metabolism.  You may notice the fetus "dropping," or moving lower in your abdomen (lightening).  You may have increased vaginal discharge.  You may notice your joints feel loose and you may have pain around your pelvic bone.  What to expect at prenatal visits You will have prenatal exams every 2 weeks until week 36. Then you will have weekly prenatal exams. During a routine prenatal visit:  You will be weighed to make sure you and the baby are growing normally.  Your blood pressure will be taken.  Your abdomen will be measured to track your baby's growth.  The fetal heartbeat will be listened to.  Any test results from the previous visit will be discussed.  You may have a cervical check near your due date to see if your cervix has softened or thinned (effaced).  You will be tested for Group B streptococcus. This happens between 35 and 37 weeks.  Your health care provider may ask you:  What your birth plan is.  How you are feeling.  If you are feeling the baby move.  If you have had   any abnormal symptoms, such as leaking fluid, bleeding, severe headaches, or abdominal cramping.  If you are using any tobacco products, including cigarettes, chewing tobacco, and electronic cigarettes.  If you have any questions.  Other tests or screenings that may be performed during your third trimester include:  Blood tests that check for low iron levels (anemia).  Fetal testing to check the health, activity level, and growth of the fetus. Testing is done if you have certain medical conditions or if there are problems during the  pregnancy.  Nonstress test (NST). This test checks the health of your baby to make sure there are no signs of problems, such as the baby not getting enough oxygen. During this test, a belt is placed around your belly. The baby is made to move, and its heart rate is monitored during movement.  What is false labor? False labor is a condition in which you feel small, irregular tightenings of the muscles in the womb (contractions) that usually go away with rest, changing position, or drinking water. These are called Braxton Hicks contractions. Contractions may last for hours, days, or even weeks before true labor sets in. If contractions come at regular intervals, become more frequent, increase in intensity, or become painful, you should see your health care provider. What are the signs of labor?  Abdominal cramps.  Regular contractions that start at 10 minutes apart and become stronger and more frequent with time.  Contractions that start on the top of the uterus and spread down to the lower abdomen and back.  Increased pelvic pressure and dull back pain.  A watery or bloody mucus discharge that comes from the vagina.  Leaking of amniotic fluid. This is also known as your "water breaking." It could be a slow trickle or a gush. Let your health care provider know if it has a color or strange odor. If you have any of these signs, call your health care provider right away, even if it is before your due date. Follow these instructions at home: Medicines  Follow your health care provider's instructions regarding medicine use. Specific medicines may be either safe or unsafe to take during pregnancy.  Take a prenatal vitamin that contains at least 600 micrograms (mcg) of folic acid.  If you develop constipation, try taking a stool softener if your health care provider approves. Eating and drinking  Eat a balanced diet that includes fresh fruits and vegetables, whole grains, good sources of protein  such as meat, eggs, or tofu, and low-fat dairy. Your health care provider will help you determine the amount of weight gain that is right for you.  Avoid raw meat and uncooked cheese. These carry germs that can cause birth defects in the baby.  If you have low calcium intake from food, talk to your health care provider about whether you should take a daily calcium supplement.  Eat four or five small meals rather than three large meals a day.  Limit foods that are high in fat and processed sugars, such as fried and sweet foods.  To prevent constipation: ? Drink enough fluid to keep your urine clear or pale yellow. ? Eat foods that are high in fiber, such as fresh fruits and vegetables, whole grains, and beans. Activity  Exercise only as directed by your health care provider. Most women can continue their usual exercise routine during pregnancy. Try to exercise for 30 minutes at least 5 days a week. Stop exercising if you experience uterine contractions.  Avoid heavy   lifting.  Do not exercise in extreme heat or humidity, or at high altitudes.  Wear low-heel, comfortable shoes.  Practice good posture.  You may continue to have sex unless your health care provider tells you otherwise. Relieving pain and discomfort  Take frequent breaks and rest with your legs elevated if you have leg cramps or low back pain.  Take warm sitz baths to soothe any pain or discomfort caused by hemorrhoids. Use hemorrhoid cream if your health care provider approves.  Wear a good support bra to prevent discomfort from breast tenderness.  If you develop varicose veins: ? Wear support pantyhose or compression stockings as told by your healthcare provider. ? Elevate your feet for 15 minutes, 3-4 times a day. Prenatal care  Write down your questions. Take them to your prenatal visits.  Keep all your prenatal visits as told by your health care provider. This is important. Safety  Wear your seat belt at  all times when driving.  Make a list of emergency phone numbers, including numbers for family, friends, the hospital, and police and fire departments. General instructions  Avoid cat litter boxes and soil used by cats. These carry germs that can cause birth defects in the baby. If you have a cat, ask someone to clean the litter box for you.  Do not travel far distances unless it is absolutely necessary and only with the approval of your health care provider.  Do not use hot tubs, steam rooms, or saunas.  Do not drink alcohol.  Do not use any products that contain nicotine or tobacco, such as cigarettes and e-cigarettes. If you need help quitting, ask your health care provider.  Do not use any medicinal herbs or unprescribed drugs. These chemicals affect the formation and growth of the baby.  Do not douche or use tampons or scented sanitary pads.  Do not cross your legs for long periods of time.  To prepare for the arrival of your baby: ? Take prenatal classes to understand, practice, and ask questions about labor and delivery. ? Make a trial run to the hospital. ? Visit the hospital and tour the maternity area. ? Arrange for maternity or paternity leave through employers. ? Arrange for family and friends to take care of pets while you are in the hospital. ? Purchase a rear-facing car seat and make sure you know how to install it in your car. ? Pack your hospital bag. ? Prepare the baby's nursery. Make sure to remove all pillows and stuffed animals from the baby's crib to prevent suffocation.  Visit your dentist if you have not gone during your pregnancy. Use a soft toothbrush to brush your teeth and be gentle when you floss. Contact a health care provider if:  You are unsure if you are in labor or if your water has broken.  You become dizzy.  You have mild pelvic cramps, pelvic pressure, or nagging pain in your abdominal area.  You have lower back pain.  You have persistent  nausea, vomiting, or diarrhea.  You have an unusual or bad smelling vaginal discharge.  You have pain when you urinate. Get help right away if:  Your water breaks before 37 weeks.  You have regular contractions less than 5 minutes apart before 37 weeks.  You have a fever.  You are leaking fluid from your vagina.  You have spotting or bleeding from your vagina.  You have severe abdominal pain or cramping.  You have rapid weight loss or weight gain.    You have shortness of breath with chest pain.  You notice sudden or extreme swelling of your face, hands, ankles, feet, or legs.  Your baby makes fewer than 10 movements in 2 hours.  You have severe headaches that do not go away when you take medicine.  You have vision changes. Summary  The third trimester is from week 28 through week 40, months 7 through 9. The third trimester is a time when the unborn baby (fetus) is growing rapidly.  During the third trimester, your discomfort may increase as you and your baby continue to gain weight. You may have abdominal, leg, and back pain, sleeping problems, and an increased need to urinate.  During the third trimester your breasts will keep growing and they will continue to become tender. A yellow fluid (colostrum) may leak from your breasts. This is the first milk you are producing for your baby.  False labor is a condition in which you feel small, irregular tightenings of the muscles in the womb (contractions) that eventually go away. These are called Braxton Hicks contractions. Contractions may last for hours, days, or even weeks before true labor sets in.  Signs of labor can include: abdominal cramps; regular contractions that start at 10 minutes apart and become stronger and more frequent with time; watery or bloody mucus discharge that comes from the vagina; increased pelvic pressure and dull back pain; and leaking of amniotic fluid. This information is not intended to replace advice  given to you by your health care provider. Make sure you discuss any questions you have with your health care provider. Document Released: 11/18/2001 Document Revised: 05/01/2016 Document Reviewed: 01/25/2013 Elsevier Interactive Patient Education  2017 [employer]  

## 2018-01-31 ENCOUNTER — Encounter: Payer: Self-pay | Admitting: *Deleted

## 2018-01-31 ENCOUNTER — Observation Stay
Admission: EM | Admit: 2018-01-31 | Discharge: 2018-01-31 | Disposition: A | Discharge status: Home / Self Care | Payer: Medicaid Other | Attending: Obstetrics and Gynecology | Admitting: Obstetrics and Gynecology

## 2018-01-31 DIAGNOSIS — O09891 Supervision of other high risk pregnancies, first trimester: Secondary | ICD-10-CM

## 2018-01-31 DIAGNOSIS — Z3480 Encounter for supervision of other normal pregnancy, unspecified trimester: Secondary | ICD-10-CM | POA: Diagnosis not present

## 2018-01-31 DIAGNOSIS — R8271 Bacteriuria: Secondary | ICD-10-CM

## 2018-01-31 DIAGNOSIS — Z3490 Encounter for supervision of normal pregnancy, unspecified, unspecified trimester: Secondary | ICD-10-CM

## 2018-01-31 DIAGNOSIS — O0990 Supervision of high risk pregnancy, unspecified, unspecified trimester: Secondary | ICD-10-CM

## 2018-01-31 DIAGNOSIS — O34219 Maternal care for unspecified type scar from previous cesarean delivery: Secondary | ICD-10-CM

## 2018-01-31 DIAGNOSIS — Z3A00 Weeks of gestation of pregnancy not specified: Secondary | ICD-10-CM | POA: Diagnosis not present

## 2018-01-31 HISTORY — DX: Unspecified asthma, uncomplicated: J45.909

## 2018-01-31 HISTORY — DX: Cardiac murmur, unspecified: R01.1

## 2018-01-31 HISTORY — DX: Anemia, unspecified: D64.9

## 2018-01-31 NOTE — OB Triage Note (Signed)
Patient fell on Saturday night around 8:30pm after she tripped over a basket of clothes.  She describes that she fell on her belly landing mostly to the right side.  She felt that baby wasn't moving as much since her fall.  She didn't come in last night because she was waiting to see if the baby's movement increased.

## 2018-01-31 NOTE — Discharge Instructions (Signed)
Please keep your appointment scheduled for this coming Thursday for follow-up.  If you have any questions or concerns please call the on-call provider.  You may also call the  Nurse's desk at [facility] at [phone].  If you have urgent concerns please go to the nearest emergency department for evaluation.

## 2018-02-01 NOTE — Discharge Summary (Signed)
See final progress note. 

## 2018-02-01 NOTE — Final Progress Note (Signed)
Patient presented for evaluation of fetus after ground level fall 20 hours prior.  She state she tripped over a clothes basket.  After the initial fall she did not feel good fetal movement. However, upon presentation, she was noting good and normal fetal movement, denied vaginal bleeding, and leakage of fluid.  I reviewed her vital signs and fetal tracing, both of which were reassuring.  She was monitored for 2 hours.    The fetal tracing was as interpreted as follows: Baseline FHR: 140 beats/min Variability: moderate Accelerations: present Decelerations: absent Tocometry: mild irritability  Interpretation:  INDICATIONS: decreased fetal movement RESULTS:  A NST procedure was performed with FHR monitoring and a normal baseline established, appropriate time of 20-40 minutes of evaluation, and accels >2 seen w 15x15 characteristics.  Results show a REACTIVE NST.    She was discharged home with precautions for vaginal bleeding, leakage of fluid, decreased fetal movement, or any other concerning symptoms.  Lori MohairStephen Eliza Green, MD 02/01/2018 6:52 AM

## 2018-02-04 ENCOUNTER — Ambulatory Visit (INDEPENDENT_AMBULATORY_CARE_PROVIDER_SITE_OTHER): Payer: Medicaid Other | Admitting: Maternal Newborn

## 2018-02-04 ENCOUNTER — Encounter: Payer: Self-pay | Admitting: Maternal Newborn

## 2018-02-04 VITALS — BP 118/70 | Wt 171.0 lb

## 2018-02-04 DIAGNOSIS — O0990 Supervision of high risk pregnancy, unspecified, unspecified trimester: Secondary | ICD-10-CM

## 2018-02-04 DIAGNOSIS — Z3A31 31 weeks gestation of pregnancy: Secondary | ICD-10-CM

## 2018-02-04 DIAGNOSIS — Z23 Encounter for immunization: Secondary | ICD-10-CM

## 2018-02-04 MED ORDER — TETANUS-DIPHTH-ACELL PERTUSSIS 5-2.5-18.5 LF-MCG/0.5 IM SUSP
0.5000 mL | Freq: Once | INTRAMUSCULAR | Status: AC
  Administered 2018-02-04: 0.5 mL via INTRAMUSCULAR

## 2018-02-04 NOTE — Progress Notes (Signed)
Routine Prenatal Care Visit  Subjective  Lori Velasquez is a 27 y.o. G2P1001 at [redacted]w[redacted]d being seen today for ongoing prenatal care.  She is currently monitored for the following issues for this high-risk pregnancy and has History of cesarean delivery, currently pregnant; Short interval between pregnancies affecting pregnancy in first trimester, antepartum; Supervision of high risk pregnancy, antepartum; GBS bacteriuria; and Pregnancy on their problem list.  ----------------------------------------------------------------------------------- Patient reports URI/sinus symptoms..   Contractions: Not present. Vag. Bleeding: None.  Movement: Present. Denies leaking of fluid.  ----------------------------------------------------------------------------------- The following portions of the patient's history were reviewed and updated as appropriate: allergies, current medications, past family history, past medical history, past social history, past surgical history and problem list. Problem list updated.   Objective  Blood pressure 118/70, weight 171 lb (77.6 kg), last menstrual period 06/26/2017. Pregravid weight 157 lb (71.2 kg) Total Weight Gain 14 lb (6.35 kg) Urinalysis: Urine Protein: Trace Urine Glucose: Negative  Fetal Status: Fetal Heart Rate (bpm): 148 Fundal Height: 33 cm Movement: Present     General:  Alert, oriented and cooperative. Patient is in no acute distress.  Skin: Skin is warm and dry. No rash noted.   Cardiovascular: Normal heart rate noted  Respiratory: Normal respiratory effort, no problems with respiration noted  Abdomen: Soft, gravid, appropriate for gestational age. Pain/Pressure: Absent     Pelvic:  Cervical exam deferred        Extremities: Normal range of motion.     Mental Status: Normal mood and affect. Normal behavior. Normal judgment and thought content.     Assessment   26 y.o. G2P1001 at [redacted]w[redacted]d, EDD 04/02/2018 by Last Menstrual Period presenting for routine  prenatal visit.  Plan   pregnancy 2 Problems (from 06/26/17 to present)    Problem Noted Resolved   GBS bacteriuria 08/11/2017 by Oswaldo Conroy, CNM No   History of cesarean delivery, currently pregnant 08/07/2017 by Oswaldo Conroy, CNM No   Overview Signed 10/05/2017  4:16 PM by Conard Novak, MD    [ ]  Desires repeat - schedule early 3rd trim      Short interval between pregnancies affecting pregnancy in first trimester, antepartum 08/07/2017 by Oswaldo Conroy, CNM No   Supervision of high risk pregnancy, antepartum 08/07/2017 by Oswaldo Conroy, CNM No   Overview Addendum 01/08/2018  6:33 PM by Conard Novak, MD    Clinic [facility] Prenatal Labs  Dating LMP Blood type: A/Positive/-- (08/31 1557)   Genetic Screen Declines Antibody:Negative (08/31 1557)  Anatomic [place]  Rubella:  Varicella:    GTT Early:               Third trimester: 105 RPR: Non Reactive (08/31 1557)   Rhogam  HBsAg: Negative (08/31 1557)   TDaP vaccine                       Flu Shot: HIV:   neg  Baby Food                                GBS:   Contraception  Pap: Normal in 2016  CBB     CS/VBAC    Support Person                 Patient does not want the On-Q pump placed during Cesarean surgery as she is concerned about her younger child accidentally getting  tangled in it. Discussed benefits but she has decided to decline. Will notify Marella BileNancy Cohen but also asked her to mention this to her surgeon during the H&P visit..  Preterm labor symptoms and general obstetric precautions including but not limited to vaginal bleeding, contractions, leaking of fluid and fetal movement were reviewed with the patient.  Return in about 2 weeks (around 02/18/2018) for ROB.  Marcelyn BruinsJacelyn Alayja Armas, CNM 02/05/2018  9:11 PM

## 2018-02-04 NOTE — Progress Notes (Signed)
C/o cold x2wks, rj

## 2018-02-18 ENCOUNTER — Ambulatory Visit (INDEPENDENT_AMBULATORY_CARE_PROVIDER_SITE_OTHER): Payer: Medicaid Other | Admitting: Advanced Practice Midwife

## 2018-02-18 VITALS — BP 122/74 | Wt 171.0 lb

## 2018-02-18 DIAGNOSIS — Z3A33 33 weeks gestation of pregnancy: Secondary | ICD-10-CM

## 2018-02-18 NOTE — Progress Notes (Signed)
No vb. No lof.  

## 2018-02-18 NOTE — Patient Instructions (Signed)
Third Trimester of Pregnancy The third trimester is from week 28 through week 40 (months 7 through 9). The third trimester is a time when the unborn baby (fetus) is growing rapidly. At the end of the ninth month, the fetus is about 20 inches in length and weighs 6-10 pounds. Body changes during your third trimester Your body will continue to go through many changes during pregnancy. The changes vary from woman to woman. During the third trimester:  Your weight will continue to increase. You can expect to gain 25-35 pounds (11-16 kg) by the end of the pregnancy.  You may begin to get stretch marks on your hips, abdomen, and breasts.  You may urinate more often because the fetus is moving lower into your pelvis and pressing on your bladder.  You may develop or continue to have heartburn. This is caused by increased hormones that slow down muscles in the digestive tract.  You may develop or continue to have constipation because increased hormones slow digestion and cause the muscles that push waste through your intestines to relax.  You may develop hemorrhoids. These are swollen veins (varicose veins) in the rectum that can itch or be painful.  You may develop swollen, bulging veins (varicose veins) in your legs.  You may have increased body aches in the pelvis, back, or thighs. This is due to weight gain and increased hormones that are relaxing your joints.  You may have changes in your hair. These can include thickening of your hair, rapid growth, and changes in texture. Some women also have hair loss during or after pregnancy, or hair that feels dry or thin. Your hair will most likely return to normal after your baby is born.  Your breasts will continue to grow and they will continue to become tender. A yellow fluid (colostrum) may leak from your breasts. This is the first milk you are producing for your baby.  Your belly button may stick out.  You may notice more swelling in your hands,  face, or ankles.  You may have increased tingling or numbness in your hands, arms, and legs. The skin on your belly may also feel numb.  You may feel short of breath because of your expanding uterus.  You may have more problems sleeping. This can be caused by the size of your belly, increased need to urinate, and an increase in your body's metabolism.  You may notice the fetus "dropping," or moving lower in your abdomen (lightening).  You may have increased vaginal discharge.  You may notice your joints feel loose and you may have pain around your pelvic bone.  What to expect at prenatal visits You will have prenatal exams every 2 weeks until week 36. Then you will have weekly prenatal exams. During a routine prenatal visit:  You will be weighed to make sure you and the baby are growing normally.  Your blood pressure will be taken.  Your abdomen will be measured to track your baby's growth.  The fetal heartbeat will be listened to.  Any test results from the previous visit will be discussed.  You may have a cervical check near your due date to see if your cervix has softened or thinned (effaced).  You will be tested for Group B streptococcus. This happens between 35 and 37 weeks.  Your health care provider may ask you:  What your birth plan is.  How you are feeling.  If you are feeling the baby move.  If you have had   any abnormal symptoms, such as leaking fluid, bleeding, severe headaches, or abdominal cramping.  If you are using any tobacco products, including cigarettes, chewing tobacco, and electronic cigarettes.  If you have any questions.  Other tests or screenings that may be performed during your third trimester include:  Blood tests that check for low iron levels (anemia).  Fetal testing to check the health, activity level, and growth of the fetus. Testing is done if you have certain medical conditions or if there are problems during the  pregnancy.  Nonstress test (NST). This test checks the health of your baby to make sure there are no signs of problems, such as the baby not getting enough oxygen. During this test, a belt is placed around your belly. The baby is made to move, and its heart rate is monitored during movement.  What is false labor? False labor is a condition in which you feel small, irregular tightenings of the muscles in the womb (contractions) that usually go away with rest, changing position, or drinking water. These are called Braxton Hicks contractions. Contractions may last for hours, days, or even weeks before true labor sets in. If contractions come at regular intervals, become more frequent, increase in intensity, or become painful, you should see your health care provider. What are the signs of labor?  Abdominal cramps.  Regular contractions that start at 10 minutes apart and become stronger and more frequent with time.  Contractions that start on the top of the uterus and spread down to the lower abdomen and back.  Increased pelvic pressure and dull back pain.  A watery or bloody mucus discharge that comes from the vagina.  Leaking of amniotic fluid. This is also known as your "water breaking." It could be a slow trickle or a gush. Let your health care provider know if it has a color or strange odor. If you have any of these signs, call your health care provider right away, even if it is before your due date. Follow these instructions at home: Medicines  Follow your health care provider's instructions regarding medicine use. Specific medicines may be either safe or unsafe to take during pregnancy.  Take a prenatal vitamin that contains at least 600 micrograms (mcg) of folic acid.  If you develop constipation, try taking a stool softener if your health care provider approves. Eating and drinking  Eat a balanced diet that includes fresh fruits and vegetables, whole grains, good sources of protein  such as meat, eggs, or tofu, and low-fat dairy. Your health care provider will help you determine the amount of weight gain that is right for you.  Avoid raw meat and uncooked cheese. These carry germs that can cause birth defects in the baby.  If you have low calcium intake from food, talk to your health care provider about whether you should take a daily calcium supplement.  Eat four or five small meals rather than three large meals a day.  Limit foods that are high in fat and processed sugars, such as fried and sweet foods.  To prevent constipation: ? Drink enough fluid to keep your urine clear or pale yellow. ? Eat foods that are high in fiber, such as fresh fruits and vegetables, whole grains, and beans. Activity  Exercise only as directed by your health care provider. Most women can continue their usual exercise routine during pregnancy. Try to exercise for 30 minutes at least 5 days a week. Stop exercising if you experience uterine contractions.  Avoid heavy   lifting.  Do not exercise in extreme heat or humidity, or at high altitudes.  Wear low-heel, comfortable shoes.  Practice good posture.  You may continue to have sex unless your health care provider tells you otherwise. Relieving pain and discomfort  Take frequent breaks and rest with your legs elevated if you have leg cramps or low back pain.  Take warm sitz baths to soothe any pain or discomfort caused by hemorrhoids. Use hemorrhoid cream if your health care provider approves.  Wear a good support bra to prevent discomfort from breast tenderness.  If you develop varicose veins: ? Wear support pantyhose or compression stockings as told by your healthcare provider. ? Elevate your feet for 15 minutes, 3-4 times a day. Prenatal care  Write down your questions. Take them to your prenatal visits.  Keep all your prenatal visits as told by your health care provider. This is important. Safety  Wear your seat belt at  all times when driving.  Make a list of emergency phone numbers, including numbers for family, friends, the hospital, and police and fire departments. General instructions  Avoid cat litter boxes and soil used by cats. These carry germs that can cause birth defects in the baby. If you have a cat, ask someone to clean the litter box for you.  Do not travel far distances unless it is absolutely necessary and only with the approval of your health care provider.  Do not use hot tubs, steam rooms, or saunas.  Do not drink alcohol.  Do not use any products that contain nicotine or tobacco, such as cigarettes and e-cigarettes. If you need help quitting, ask your health care provider.  Do not use any medicinal herbs or unprescribed drugs. These chemicals affect the formation and growth of the baby.  Do not douche or use tampons or scented sanitary pads.  Do not cross your legs for long periods of time.  To prepare for the arrival of your baby: ? Take prenatal classes to understand, practice, and ask questions about labor and delivery. ? Make a trial run to the hospital. ? Visit the hospital and tour the maternity area. ? Arrange for maternity or paternity leave through employers. ? Arrange for family and friends to take care of pets while you are in the hospital. ? Purchase a rear-facing car seat and make sure you know how to install it in your car. ? Pack your hospital bag. ? Prepare the baby's nursery. Make sure to remove all pillows and stuffed animals from the baby's crib to prevent suffocation.  Visit your dentist if you have not gone during your pregnancy. Use a soft toothbrush to brush your teeth and be gentle when you floss. Contact a health care provider if:  You are unsure if you are in labor or if your water has broken.  You become dizzy.  You have mild pelvic cramps, pelvic pressure, or nagging pain in your abdominal area.  You have lower back pain.  You have persistent  nausea, vomiting, or diarrhea.  You have an unusual or bad smelling vaginal discharge.  You have pain when you urinate. Get help right away if:  Your water breaks before 37 weeks.  You have regular contractions less than 5 minutes apart before 37 weeks.  You have a fever.  You are leaking fluid from your vagina.  You have spotting or bleeding from your vagina.  You have severe abdominal pain or cramping.  You have rapid weight loss or weight gain.    You have shortness of breath with chest pain.  You notice sudden or extreme swelling of your face, hands, ankles, feet, or legs.  Your baby makes fewer than 10 movements in 2 hours.  You have severe headaches that do not go away when you take medicine.  You have vision changes. Summary  The third trimester is from week 28 through week 40, months 7 through 9. The third trimester is a time when the unborn baby (fetus) is growing rapidly.  During the third trimester, your discomfort may increase as you and your baby continue to gain weight. You may have abdominal, leg, and back pain, sleeping problems, and an increased need to urinate.  During the third trimester your breasts will keep growing and they will continue to become tender. A yellow fluid (colostrum) may leak from your breasts. This is the first milk you are producing for your baby.  False labor is a condition in which you feel small, irregular tightenings of the muscles in the womb (contractions) that eventually go away. These are called Braxton Hicks contractions. Contractions may last for hours, days, or even weeks before true labor sets in.  Signs of labor can include: abdominal cramps; regular contractions that start at 10 minutes apart and become stronger and more frequent with time; watery or bloody mucus discharge that comes from the vagina; increased pelvic pressure and dull back pain; and leaking of amniotic fluid. This information is not intended to replace advice  given to you by your health care provider. Make sure you discuss any questions you have with your health care provider. Document Released: 11/18/2001 Document Revised: 05/01/2016 Document Reviewed: 01/25/2013 Elsevier Interactive Patient Education  2017 [employer]  

## 2018-02-18 NOTE — Progress Notes (Signed)
Routine Prenatal Care Visit  Subjective  Lori Velasquez is a 27 y.o. G2P1001 at [redacted]w[redacted]d being seen today for ongoing prenatal care.  She is currently monitored for the following issues for this high-risk pregnancy and has History of cesarean delivery, currently pregnant; Short interval between pregnancies affecting pregnancy in first trimester, antepartum; Supervision of high risk pregnancy, antepartum; GBS bacteriuria; and Pregnancy on their problem list.  ----------------------------------------------------------------------------------- Patient reports no complaints.   Contractions: Not present. Vag. Bleeding: None.  Movement: Present. Denies leaking of fluid.  ----------------------------------------------------------------------------------- The following portions of the patient's history were reviewed and updated as appropriate: allergies, current medications, past family history, past medical history, past social history, past surgical history and problem list. Problem list updated.   Objective  Blood pressure 122/74, weight 171 lb (77.6 kg), last menstrual period 06/26/2017. Pregravid weight 157 lb (71.2 kg) Total Weight Gain 14 lb (6.35 kg) Urinalysis: Urine Protein: Negative Urine Glucose: Negative  Fetal Status: Fetal Heart Rate (bpm): 152 Fundal Height: 35 cm Movement: Present     General:  Alert, oriented and cooperative. Patient is in no acute distress.  Skin: Skin is warm and dry. No rash noted.   Cardiovascular: Normal heart rate noted  Respiratory: Normal respiratory effort, no problems with respiration noted  Abdomen: Soft, gravid, appropriate for gestational age. Pain/Pressure: Absent     Pelvic:  Cervical exam deferred        Extremities: Normal range of motion.     Mental Status: Normal mood and affect. Normal behavior. Normal judgment and thought content.   Assessment   27 y.o. G2P1001 at [redacted]w[redacted]d by  04/02/2018, by Last Menstrual Period presenting for routine prenatal  visit  Plan   pregnancy 2 Problems (from 06/26/17 to present)    Problem Noted Resolved   GBS bacteriuria 08/11/2017 by Oswaldo Conroy, CNM No   History of cesarean delivery, currently pregnant 08/07/2017 by Oswaldo Conroy, CNM No   Overview Signed 10/05/2017  4:16 PM by Conard Novak, MD    [ ]  Desires repeat - schedule early 3rd trim      Short interval between pregnancies affecting pregnancy in first trimester, antepartum 08/07/2017 by Oswaldo Conroy, CNM No   Supervision of high risk pregnancy, antepartum 08/07/2017 by Oswaldo Conroy, CNM No   Overview Addendum 01/08/2018  6:33 PM by Conard Novak, MD    Clinic [facility] Prenatal Labs  Dating LMP Blood type: A/Positive/-- (08/31 1557)   Genetic Screen Declines Antibody:Negative (08/31 1557)  Anatomic [place]  Rubella:  Varicella:    GTT Early:               Third trimester: 105 RPR: Non Reactive (08/31 1557)   Rhogam  HBsAg: Negative (08/31 1557)   TDaP vaccine                       Flu Shot: HIV:   neg  Baby Food                                GBS:   Contraception  Pap: Normal in 2016  CBB     CS/VBAC    Support Person                  Preterm labor symptoms and general obstetric precautions including but not limited to vaginal bleeding, contractions, leaking of fluid and fetal movement were reviewed  in detail with the patient. Please refer to After Visit Summary for other counseling recommendations.   Return in about 2 weeks (around 03/04/2018) for rob.  Tresea MallJane Janee Ureste, CNM 02/18/2018 3:35 PM

## 2018-03-04 ENCOUNTER — Ambulatory Visit (INDEPENDENT_AMBULATORY_CARE_PROVIDER_SITE_OTHER): Payer: Medicaid Other | Admitting: Obstetrics and Gynecology

## 2018-03-04 VITALS — BP 118/76 | Wt 176.0 lb

## 2018-03-04 DIAGNOSIS — O0990 Supervision of high risk pregnancy, unspecified, unspecified trimester: Secondary | ICD-10-CM

## 2018-03-04 DIAGNOSIS — O99013 Anemia complicating pregnancy, third trimester: Secondary | ICD-10-CM

## 2018-03-04 DIAGNOSIS — Z3A35 35 weeks gestation of pregnancy: Secondary | ICD-10-CM

## 2018-03-04 DIAGNOSIS — O09891 Supervision of other high risk pregnancies, first trimester: Secondary | ICD-10-CM

## 2018-03-04 DIAGNOSIS — O34219 Maternal care for unspecified type scar from previous cesarean delivery: Secondary | ICD-10-CM

## 2018-03-04 MED ORDER — FERROUS SULFATE 325 (65 FE) MG PO TABS: 325.0000 mg | ORAL_TABLET | Freq: Three times a day (TID) | ORAL | 11 refills | Status: DC

## 2018-03-04 NOTE — Progress Notes (Signed)
ROB GBS  No vn. No lof.

## 2018-03-04 NOTE — Addendum Note (Signed)
Addended by: Adelene IdlerSCHUMAN, CHRISTANNA on: 03/04/2018 04:45 PM   Modules accepted: Orders

## 2018-03-04 NOTE — Progress Notes (Addendum)
Routine Prenatal Care Visit  Subjective  Lori Velasquez is a 27 y.o. G2P1001 at 5676w6d being seen today for ongoing prenatal care.  She is currently monitored for the following issues for this high-risk pregnancy and has History of cesarean delivery, currently pregnant; Short interval between pregnancies affecting pregnancy in first trimester, antepartum; Supervision of high risk pregnancy, antepartum; GBS bacteriuria; and Pregnancy on their problem list.  ----------------------------------------------------------------------------------- Patient reports no complaints.   Contractions: Not present. Vag. Bleeding: None.  Movement: Present. Denies leaking of fluid.  ----------------------------------------------------------------------------------- The following portions of the patient's history were reviewed and updated as appropriate: allergies, current medications, past family history, past medical history, past social history, past surgical history and problem list. Problem list updated.   Objective  Blood pressure 118/76, weight 176 lb (79.8 kg), last menstrual period 06/26/2017. Pregravid weight 157 lb (71.2 kg) Total Weight Gain 19 lb (8.618 kg) Urinalysis: Urine Protein: 2+ Urine Glucose: Negative  Fetal Status: Fetal Heart Rate (bpm): 166 Fundal Height: 36 cm Movement: Present     General:  Alert, oriented and cooperative. Patient is in no acute distress.  Skin: Skin is warm and dry. No rash noted.   Cardiovascular: Normal heart rate noted  Respiratory: Normal respiratory effort, no problems with respiration noted  Abdomen: Soft, gravid, appropriate for gestational age. Pain/Pressure: Absent     Pelvic:  Cervical exam deferred        Extremities: Normal range of motion.     ental Status: Normal mood and affect. Normal behavior. Normal judgment and thought content.     Assessment   27 y.o. G2P1001 at 7376w6d by  04/02/2018, by Last Menstrual Period presenting for routine prenatal  visit  Plan   pregnancy 2 Problems (from 06/26/17 to present)    Problem Noted Resolved   GBS bacteriuria 08/11/2017 by Oswaldo ConroySchmid, Jacelyn Y, CNM No   History of cesarean delivery, currently pregnant 08/07/2017 by Oswaldo ConroySchmid, Jacelyn Y, CNM No   Overview Signed 10/05/2017  4:16 PM by Conard NovakJackson, Stephen D, MD    [ ]  Desires repeat - schedule early 3rd trim      Short interval between pregnancies affecting pregnancy in first trimester, antepartum 08/07/2017 by Oswaldo ConroySchmid, Jacelyn Y, CNM No   Supervision of high risk pregnancy, antepartum 08/07/2017 by Oswaldo ConroySchmid, Jacelyn Y, CNM No   Overview Addendum 03/04/2018  4:38 PM by Natale MilchSchuman, Christanna R, MD    Clinic [facility] Prenatal Labs  Dating LMP Blood type: A/Positive/-- (08/31 1557)   Genetic Screen Declines Antibody:Negative (08/31 1557)  Anatomic US  Rubella:  Varicella:    GTT Early:               Third trimester: 105 RPR: Non Reactive (08/31 1557)   Rhogam  not needed HBsAg: Negative (08/31 1557)   TDaP vaccine                       Flu Shot: HIV:   neg  Baby Food    Breast/Bottle                        GBS:   Contraception  Nexplanon Pap: Normal in 2016  CBB  Yes   CS/VBAC c/s   Support Person husband                 Gestational age appropriate obstetric precautions including but not limited to vaginal bleeding, contractions, leaking of fluid and fetal movement were  reviewed in detail with the patient.    Patient is anemic, has not been taking Iron supplements, surgery approaching, will refer for consultation with hematology oncology and possible IV iron transfusion.  Given information on cord blood banking, volunteer doula program and birth control.  GBS/ GC CT today Cesarean section planned Return in about 1 week (around 03/11/2018) for ROB.  Adelene Idler MD [facility], [facility] 03/04/2018, 4:38 PM

## 2018-03-08 LAB — GC/CHLAMYDIA PROBE AMP
CHLAMYDIA, DNA PROBE: NEGATIVE
Neisseria gonorrhoeae by PCR: NEGATIVE

## 2018-03-08 LAB — CULTURE, BETA STREP (GROUP B ONLY): STREP GP B CULTURE: POSITIVE — AB

## 2018-03-11 ENCOUNTER — Encounter: Payer: Self-pay | Admitting: Oncology

## 2018-03-11 ENCOUNTER — Inpatient Hospital Stay: Payer: Medicaid Other

## 2018-03-11 ENCOUNTER — Ambulatory Visit (INDEPENDENT_AMBULATORY_CARE_PROVIDER_SITE_OTHER): Payer: Medicaid Other | Admitting: Advanced Practice Midwife

## 2018-03-11 ENCOUNTER — Inpatient Hospital Stay: Payer: Medicaid Other | Attending: Oncology | Admitting: Oncology

## 2018-03-11 ENCOUNTER — Encounter: Payer: Self-pay | Admitting: Advanced Practice Midwife

## 2018-03-11 VITALS — BP 104/74 | Wt 176.0 lb

## 2018-03-11 VITALS — BP 119/72 | HR 110 | Temp 97.9°F | Resp 16 | Wt 175.0 lb

## 2018-03-11 DIAGNOSIS — Z79899 Other long term (current) drug therapy: Secondary | ICD-10-CM | POA: Diagnosis not present

## 2018-03-11 DIAGNOSIS — O99013 Anemia complicating pregnancy, third trimester: Secondary | ICD-10-CM | POA: Diagnosis not present

## 2018-03-11 DIAGNOSIS — D509 Iron deficiency anemia, unspecified: Secondary | ICD-10-CM | POA: Diagnosis not present

## 2018-03-11 DIAGNOSIS — Z3A37 37 weeks gestation of pregnancy: Secondary | ICD-10-CM | POA: Diagnosis not present

## 2018-03-11 DIAGNOSIS — R5383 Other fatigue: Secondary | ICD-10-CM | POA: Insufficient documentation

## 2018-03-11 DIAGNOSIS — D508 Other iron deficiency anemias: Secondary | ICD-10-CM

## 2018-03-11 DIAGNOSIS — R5381 Other malaise: Secondary | ICD-10-CM | POA: Diagnosis not present

## 2018-03-11 DIAGNOSIS — Z3A36 36 weeks gestation of pregnancy: Secondary | ICD-10-CM

## 2018-03-11 HISTORY — DX: Iron deficiency anemia, unspecified: D50.9

## 2018-03-11 LAB — CBC WITH DIFFERENTIAL/PLATELET
BASOS ABS: 0 10*3/uL (ref 0–0.1)
BASOS PCT: 0 %
Eosinophils Absolute: 0 10*3/uL (ref 0–0.7)
Eosinophils Relative: 0 %
HEMATOCRIT: 26.3 % — AB (ref 35.0–47.0)
HEMOGLOBIN: 8.1 g/dL — AB (ref 12.0–16.0)
Lymphocytes Relative: 17 %
Lymphs Abs: 1.7 10*3/uL (ref 1.0–3.6)
MCH: 20.1 pg — ABNORMAL LOW (ref 26.0–34.0)
MCHC: 31 g/dL — AB (ref 32.0–36.0)
MCV: 64.8 fL — ABNORMAL LOW (ref 80.0–100.0)
MONOS PCT: 7 %
Monocytes Absolute: 0.7 10*3/uL (ref 0.2–0.9)
NEUTROS ABS: 7.3 10*3/uL — AB (ref 1.4–6.5)
NEUTROS PCT: 76 %
Platelets: 166 10*3/uL (ref 150–440)
RBC: 4.06 MIL/uL (ref 3.80–5.20)
RDW: 17.8 % — ABNORMAL HIGH (ref 11.5–14.5)
WBC: 9.8 10*3/uL (ref 3.6–11.0)

## 2018-03-11 LAB — RETICULOCYTES
RBC.: 4.22 MIL/uL (ref 3.80–5.20)
Retic Count, Absolute: 97.1 10*3/uL (ref 19.0–183.0)
Retic Ct Pct: 2.3 % (ref 0.4–3.1)

## 2018-03-11 LAB — IRON AND TIBC
Iron: 23 ug/dL — ABNORMAL LOW (ref 28–170)
Saturation Ratios: 3 % — ABNORMAL LOW (ref 10.4–31.8)
TIBC: 717 ug/dL — AB (ref 250–450)
UIBC: 694 ug/dL

## 2018-03-11 LAB — FERRITIN: Ferritin: 2 ng/mL — ABNORMAL LOW (ref 11–307)

## 2018-03-11 NOTE — Progress Notes (Signed)
Routine Prenatal Care Visit  Subjective  Lori Velasquez is a 27 y.o. G2P1001 at 662w6d being seen today for ongoing prenatal care.  She is currently monitored for the following issues for this high-risk pregnancy and has History of cesarean delivery, currently pregnant; Short interval between pregnancies affecting pregnancy in first trimester, antepartum; Supervision of high risk pregnancy, antepartum; GBS bacteriuria; Pregnancy; and Iron deficiency anemia on their problem list.  ----------------------------------------------------------------------------------- Patient reports discomforts of third trimester.  Patient had preliminary visit with hematology today and has 4 scheduled Fe transfusions Contractions: Not present. Vag. Bleeding: None.  Movement: Present. Denies leaking of fluid.  ----------------------------------------------------------------------------------- The following portions of the patient's history were reviewed and updated as appropriate: allergies, current medications, past family history, past medical history, past social history, past surgical history and problem list. Problem list updated.   Objective  Blood pressure 104/74, weight 176 lb (79.8 kg), last menstrual period 06/26/2017. Pregravid weight 157 lb (71.2 kg) Total Weight Gain 19 lb (8.618 kg) Urinalysis: Urine Protein: 1+ Urine Glucose: Negative  Fetal Status: Fetal Heart Rate (bpm): 138 Fundal Height: 37 cm Movement: Present     General:  Alert, oriented and cooperative. Patient is in no acute distress.  Skin: Skin is warm and dry. No rash noted.   Cardiovascular: Normal heart rate noted  Respiratory: Normal respiratory effort, no problems with respiration noted  Abdomen: Soft, gravid, appropriate for gestational age. Pain/Pressure: Absent     Pelvic:  Cervical exam deferred        Extremities: Normal range of motion.     Mental Status: Normal mood and affect. Normal behavior. Normal judgment and thought  content.   Assessment   27 y.o. G2P1001 at 742w6d by  04/02/2018, by Last Menstrual Period presenting for routine prenatal visit  Plan   pregnancy 2 Problems (from 06/26/17 to present)    Problem Noted Resolved   GBS bacteriuria 08/11/2017 by Oswaldo ConroySchmid, Jacelyn Y, CNM No   History of cesarean delivery, currently pregnant 08/07/2017 by Oswaldo ConroySchmid, Jacelyn Y, CNM No   Overview Signed 10/05/2017  4:16 PM by Conard NovakJackson, Stephen D, MD    [ ]  Desires repeat - schedule early 3rd trim      Short interval between pregnancies affecting pregnancy in first trimester, antepartum 08/07/2017 by Oswaldo ConroySchmid, Jacelyn Y, CNM No   Supervision of high risk pregnancy, antepartum 08/07/2017 by Oswaldo ConroySchmid, Jacelyn Y, CNM No   Overview Addendum 03/04/2018  4:38 PM by Natale MilchSchuman, Christanna R, MD    Clinic [facility] Prenatal Labs  Dating LMP Blood type: A/Positive/-- (08/31 1557)   Genetic Screen Declines Antibody:Negative (08/31 1557)  Anatomic US  Rubella:  Varicella:    GTT Early:               Third trimester: 105 RPR: Non Reactive (08/31 1557)   Rhogam  not needed HBsAg: Negative (08/31 1557)   TDaP vaccine                       Flu Shot: HIV:   neg  Baby Food    Breast/Bottle                        GBS:   Contraception  Nexplanon Pap: Normal in 2016  CBB  Yes   CS/VBAC c/s   Support Person husband                 Preterm labor symptoms and general obstetric precautions  including but not limited to vaginal bleeding, contractions, leaking of fluid and fetal movement were reviewed in detail with the patient. Please refer to After Visit Summary for other counseling recommendations.   Return in about 1 week (around 03/18/2018) for rob.  Tresea Mall, CNM 03/11/2018 3:12 PM

## 2018-03-11 NOTE — Progress Notes (Signed)
ROB

## 2018-03-12 ENCOUNTER — Inpatient Hospital Stay: Payer: Medicaid Other

## 2018-03-12 VITALS — BP 104/70 | HR 108 | Temp 97.0°F | Resp 16

## 2018-03-12 DIAGNOSIS — D508 Other iron deficiency anemias: Secondary | ICD-10-CM

## 2018-03-12 DIAGNOSIS — O99013 Anemia complicating pregnancy, third trimester: Secondary | ICD-10-CM | POA: Diagnosis not present

## 2018-03-12 LAB — HAPTOGLOBIN: HAPTOGLOBIN: 94 mg/dL (ref 34–200)

## 2018-03-12 MED ORDER — IRON SUCROSE 20 MG/ML IV SOLN
200.0000 mg | Freq: Once | INTRAVENOUS | Status: AC
  Administered 2018-03-12: 200 mg via INTRAVENOUS
  Filled 2018-03-12: qty 10

## 2018-03-12 MED ORDER — SODIUM CHLORIDE 0.9 % IV SOLN
Freq: Once | INTRAVENOUS | Status: AC
  Administered 2018-03-12: 14:00:00 via INTRAVENOUS
  Filled 2018-03-12: qty 1000

## 2018-03-13 NOTE — Progress Notes (Signed)
Hematology/Oncology follow up note [facility] Telephone:([phone]) [phone] [phone]   Patient Care Team: Patient, No Pcp Per as PCP - General (General Practice)  REFERRING PROVIDER: Dr.Schuman CHIEF COMPLAINTS/PURPOSE OF CONSULTATION:  Evaluation of iron deficiency anemia  HISTORY OF PRESENTING ILLNESS:  Lori Velasquez is a  27 y.o.  female with PMH listed below who was referred to me for evaluation of iron deficiency anemia.  She is 37 weeks of pregnant for her second child. Reports feeling fatigue, otherwise feeling well.  Denies hematochezia or black tarry stool.   Review of Systems  Constitutional: Positive for malaise/fatigue. Negative for chills and fever.  HENT: Negative for ear discharge and ear pain.   Eyes: Negative for photophobia and pain.  Respiratory: Negative for cough and sputum production.   Cardiovascular: Negative for orthopnea, claudication and leg swelling.  Gastrointestinal: Negative for abdominal pain, nausea and vomiting.  Genitourinary: Negative for dysuria, frequency and hematuria.  Musculoskeletal: Negative for myalgias.  Skin: Negative for rash.  Neurological: Negative for dizziness.  Endo/Heme/Allergies: Does not bruise/bleed easily.  Psychiatric/Behavioral: Negative for depression and suicidal ideas.    MEDICAL HISTORY:  Past Medical History:  Diagnosis Date  . Anemia   . Asthma   . Heart murmur    cardiac US done during last pregnancy   . Iron deficiency anemia 03/11/2018    SURGICAL HISTORY: Past Surgical History:  Procedure Laterality Date  . CESAREAN SECTION    . surgery for esophagela atresia as infant      SOCIAL HISTORY: Social History   Socioeconomic History  . Marital status: Single    Spouse name: Not on file  . Number of children: Not on file  . Years of education: Not on file  . Highest education level: Not on file  Occupational History  . Not on file  Social Needs  . Financial resource  strain: Not on file  . Food insecurity:    Worry: Not on file    Inability: Not on file  . Transportation needs:    Medical: Not on file    Non-medical: Not on file  Tobacco Use  . Smoking status: Never Smoker  . Smokeless tobacco: Never Used  Substance and Sexual Activity  . Alcohol use: No  . Drug use: No  . Sexual activity: Yes    Birth control/protection: None    Comment: plans for nexplanon after delivery  Lifestyle  . Physical activity:    Days per week: Not on file    Minutes per session: Not on file  . Stress: Not on file  Relationships  . Social connections:    Talks on phone: Not on file    Gets together: Not on file    Attends religious service: Not on file    Active member of club or organization: Not on file    Attends meetings of clubs or organizations: Not on file    Relationship status: Not on file  . Intimate partner violence:    Fear of current or ex partner: Not on file    Emotionally abused: Not on file    Physically abused: Not on file    Forced sexual activity: Not on file  Other Topics Concern  . Not on file  Social History Narrative  . Not on file    FAMILY HISTORY: Family History  Problem Relation Age of Onset  . Anemia Mother   . Diabetes Maternal Grandfather     ALLERGIES:  is allergic to  peppermint oil and diphenhydramine hcl.  MEDICATIONS:  Current Outpatient Medications  Medication Sig Dispense Refill  . Prenatal Vit-Fe Fumarate-FA (PRENATAL MULTIVITAMIN) TABS tablet Take 1 tablet by mouth daily at 12 noon.    . ferrous sulfate 325 (65 FE) MG tablet Take 1 tablet (325 mg total) by mouth 3 (three) times daily with meals. (Patient not taking: Reported on 03/11/2018) 90 tablet 11   No current facility-administered medications for this visit.      PHYSICAL EXAMINATION: ECOG PERFORMANCE STATUS: 0 - Asymptomatic Vitals:   03/11/18 1343  BP: 119/72  Pulse: (!) 110  Resp: 16  Temp: 97.9 F (36.6 C)   Filed Weights   03/11/18  1343  Weight: 175 lb (79.4 kg)    Physical Exam  Constitutional: She appears well-developed. No distress.  HENT:  Head: Normocephalic and atraumatic.  Eyes: Pupils are equal, round, and reactive to light. EOM are normal.  Cardiovascular: Normal rate, regular rhythm and normal heart sounds.  Pulmonary/Chest: Effort normal and breath sounds normal.  Abdominal: Soft. Bowel sounds are normal.  Gravid Uterus.   Musculoskeletal: Normal range of motion. She exhibits no edema.  Lymphadenopathy:    She has no cervical adenopathy.  Neurological: No cranial nerve deficit. Coordination normal.  Skin: Skin is warm and dry.  Psychiatric: She has a normal mood and affect.     LABORATORY DATA:  I have reviewed the data as listed Lab Results  Component Value Date   WBC 9.8 03/11/2018   HGB 8.1 (L) 03/11/2018   HCT 26.3 (L) 03/11/2018   MCV 64.8 (L) 03/11/2018   PLT 166 03/11/2018   No results for input(s): NA, K, CL, CO2, GLUCOSE, BUN, CREATININE, CALCIUM, GFRNONAA, GFRAA, PROT, ALBUMIN, AST, ALT, ALKPHOS, BILITOT, BILIDIR, IBILI in the last 8760 hours.     ASSESSMENT & PLAN:  1. Microcytic anemia   2. [redacted] weeks gestation of pregnancy   3. Other iron deficiency anemia    Lab results are discussed with patient and her mother.  Plan IV iron with Venofer 200mg  twice a week for 4 doses. Allergy reactions/infusion reaction including anaphylactic reaction discussed with patient. Patient voices understanding and willing to proceed. All questions were answered. The patient knows to call the clinic with any problems questions or concerns.  Return of visit: 6 weeks.  Thank you for this kind referral and the opportunity to participate in the care of this patient. A copy of today's note is routed to referring provider    Rickard Patience, MD, PhD Hematology Oncology [facility] Pager- [phone] 03/13/2018

## 2018-03-16 ENCOUNTER — Inpatient Hospital Stay: Payer: Medicaid Other

## 2018-03-16 VITALS — BP 125/78 | HR 98 | Temp 98.0°F | Resp 20

## 2018-03-16 DIAGNOSIS — D508 Other iron deficiency anemias: Secondary | ICD-10-CM

## 2018-03-16 DIAGNOSIS — O99013 Anemia complicating pregnancy, third trimester: Secondary | ICD-10-CM | POA: Diagnosis not present

## 2018-03-16 MED ORDER — SODIUM CHLORIDE 0.9 % IV SOLN
Freq: Once | INTRAVENOUS | Status: AC
  Administered 2018-03-16: 15:00:00 via INTRAVENOUS
  Filled 2018-03-16: qty 1000

## 2018-03-16 MED ORDER — IRON SUCROSE 20 MG/ML IV SOLN
200.0000 mg | Freq: Once | INTRAVENOUS | Status: AC
Start: 2018-03-16 — End: 2018-03-16
  Administered 2018-03-16: 200 mg via INTRAVENOUS
  Filled 2018-03-16: qty 10

## 2018-03-18 ENCOUNTER — Inpatient Hospital Stay: Payer: Medicaid Other

## 2018-03-18 ENCOUNTER — Encounter: Payer: Self-pay | Admitting: Advanced Practice Midwife

## 2018-03-18 ENCOUNTER — Ambulatory Visit (INDEPENDENT_AMBULATORY_CARE_PROVIDER_SITE_OTHER): Payer: Medicaid Other | Admitting: Advanced Practice Midwife

## 2018-03-18 VITALS — BP 112/74 | HR 108 | Temp 99.0°F | Resp 20

## 2018-03-18 VITALS — BP 114/74 | Wt 178.0 lb

## 2018-03-18 DIAGNOSIS — Z3A37 37 weeks gestation of pregnancy: Secondary | ICD-10-CM

## 2018-03-18 DIAGNOSIS — D508 Other iron deficiency anemias: Secondary | ICD-10-CM

## 2018-03-18 DIAGNOSIS — O99013 Anemia complicating pregnancy, third trimester: Secondary | ICD-10-CM | POA: Diagnosis not present

## 2018-03-18 MED ORDER — IRON SUCROSE 20 MG/ML IV SOLN
200.0000 mg | Freq: Once | INTRAVENOUS | Status: AC
  Administered 2018-03-18: 200 mg via INTRAVENOUS
  Filled 2018-03-18: qty 10

## 2018-03-18 MED ORDER — SODIUM CHLORIDE 0.9 % IV SOLN
Freq: Once | INTRAVENOUS | Status: AC
  Administered 2018-03-18: 14:00:00 via INTRAVENOUS
  Filled 2018-03-18: qty 1000

## 2018-03-18 NOTE — Progress Notes (Signed)
No vb. No lof.  

## 2018-03-18 NOTE — Progress Notes (Signed)
Routine Prenatal Care Visit  Subjective  Lori Velasquez is a 27 y.o. G2P1001 at 7561w6d being seen today for ongoing prenatal care.  She is currently monitored for the following issues for this high-risk pregnancy and has History of cesarean delivery, currently pregnant; Short interval between pregnancies affecting pregnancy in first trimester, antepartum; Supervision of high risk pregnancy, antepartum; GBS bacteriuria; Pregnancy; Iron deficiency anemia; and Encounter for genetic counseling on their problem list.  ----------------------------------------------------------------------------------- Patient reports occasional contractions and back and hip pain.  She had her 3rd of 4 Iron transfusions today and tolerated well. Her last transfusion is on Tuesday. She is to return to hematology in May for follow up labs.  Contractions: Not present. Vag. Bleeding: None.  Movement: Present. Denies leaking of fluid.  ----------------------------------------------------------------------------------- The following portions of the patient's history were reviewed and updated as appropriate: allergies, current medications, past family history, past medical history, past social history, past surgical history and problem list. Problem list updated.   Objective  Blood pressure 114/74, weight 178 lb (80.7 kg), last menstrual period 06/26/2017. Pregravid weight 157 lb (71.2 kg) Total Weight Gain 21 lb (9.526 kg) Urinalysis:      Fetal Status: Fetal Heart Rate (bpm): 140 Fundal Height: 39 cm Movement: Present     General:  Alert, oriented and cooperative. Patient is in no acute distress.  Skin: Skin is warm and dry. No rash noted.   Cardiovascular: Normal heart rate noted  Respiratory: Normal respiratory effort, no problems with respiration noted  Abdomen: Soft, gravid, appropriate for gestational age. Pain/Pressure: Absent     Pelvic:  Cervical exam deferred        Extremities: Normal range of motion.  Edema:  Trace  Mental Status: Normal mood and affect. Normal behavior. Normal judgment and thought content.   Assessment   27 y.o. G2P1001 at 561w6d by  04/02/2018, by Last Menstrual Period presenting for routine prenatal visit  Plan   pregnancy 2 Problems (from 06/26/17 to present)    Problem Noted Resolved   GBS bacteriuria 08/11/2017 by Oswaldo ConroySchmid, Jacelyn Y, CNM No   History of cesarean delivery, currently pregnant 08/07/2017 by Oswaldo ConroySchmid, Jacelyn Y, CNM No   Overview Signed 10/05/2017  4:16 PM by Conard NovakJackson, Stephen D, MD    [x]  Desires repeat - schedule early 3rd trim      Short interval between pregnancies affecting pregnancy in first trimester, antepartum 08/07/2017 by Oswaldo ConroySchmid, Jacelyn Y, CNM No   Supervision of high risk pregnancy, antepartum 08/07/2017 by Oswaldo ConroySchmid, Jacelyn Y, CNM No   Overview Addendum 03/04/2018  4:38 PM by Natale MilchSchuman, Christanna R, MD    Clinic [facility] Prenatal Labs  Dating LMP Blood type: A/Positive/-- (08/31 1557)   Genetic Screen Declines Antibody:Negative (08/31 1557)  Anatomic US  Rubella:  Varicella:    GTT Early:               Third trimester: 105 RPR: Non Reactive (08/31 1557)   Rhogam  not needed HBsAg: Negative (08/31 1557)   TDaP vaccine                       Flu Shot: HIV:   neg  Baby Food    Breast/Bottle                        GBS:   Contraception  Nexplanon Pap: Normal in 2016  CBB  Yes   CS/VBAC c/s   Support Person husband  Term labor symptoms and general obstetric precautions including but not limited to vaginal bleeding, contractions, leaking of fluid and fetal movement were reviewed in detail with the patient.    Return in about 1 week (around 03/25/2018) for rob.  Tresea Mall, CNM 03/18/2018 3:48 PM

## 2018-03-19 ENCOUNTER — Ambulatory Visit: Payer: Medicaid Other

## 2018-03-23 ENCOUNTER — Inpatient Hospital Stay: Payer: Medicaid Other

## 2018-03-23 VITALS — BP 119/79 | HR 104 | Temp 98.3°F | Resp 18

## 2018-03-23 DIAGNOSIS — O99013 Anemia complicating pregnancy, third trimester: Secondary | ICD-10-CM | POA: Diagnosis not present

## 2018-03-23 DIAGNOSIS — D508 Other iron deficiency anemias: Secondary | ICD-10-CM

## 2018-03-23 MED ORDER — IRON SUCROSE 20 MG/ML IV SOLN
200.0000 mg | Freq: Once | INTRAVENOUS | Status: AC
  Administered 2018-03-23: 200 mg via INTRAVENOUS
  Filled 2018-03-23: qty 10

## 2018-03-23 MED ORDER — SODIUM CHLORIDE 0.9 % IV SOLN
Freq: Once | INTRAVENOUS | Status: AC
  Administered 2018-03-23: 14:00:00 via INTRAVENOUS
  Filled 2018-03-23: qty 1000

## 2018-03-25 ENCOUNTER — Ambulatory Visit (INDEPENDENT_AMBULATORY_CARE_PROVIDER_SITE_OTHER): Payer: Medicaid Other | Admitting: Obstetrics & Gynecology

## 2018-03-25 ENCOUNTER — Encounter: Payer: Self-pay | Admitting: *Deleted

## 2018-03-25 ENCOUNTER — Observation Stay
Admission: EM | Admit: 2018-03-25 | Discharge: 2018-03-25 | Disposition: A | Discharge status: Home / Self Care | Payer: Medicaid Other | Attending: Obstetrics and Gynecology | Admitting: Obstetrics and Gynecology

## 2018-03-25 VITALS — BP 130/80 | Wt 176.0 lb

## 2018-03-25 DIAGNOSIS — O09891 Supervision of other high risk pregnancies, first trimester: Secondary | ICD-10-CM

## 2018-03-25 DIAGNOSIS — R8271 Bacteriuria: Secondary | ICD-10-CM

## 2018-03-25 DIAGNOSIS — Z3A28 28 weeks gestation of pregnancy: Secondary | ICD-10-CM | POA: Diagnosis not present

## 2018-03-25 DIAGNOSIS — R102 Pelvic and perineal pain: Secondary | ICD-10-CM | POA: Insufficient documentation

## 2018-03-25 DIAGNOSIS — O34219 Maternal care for unspecified type scar from previous cesarean delivery: Secondary | ICD-10-CM

## 2018-03-25 DIAGNOSIS — Z3A39 39 weeks gestation of pregnancy: Secondary | ICD-10-CM | POA: Insufficient documentation

## 2018-03-25 DIAGNOSIS — O0990 Supervision of high risk pregnancy, unspecified, unspecified trimester: Secondary | ICD-10-CM

## 2018-03-25 DIAGNOSIS — Z3A38 38 weeks gestation of pregnancy: Secondary | ICD-10-CM

## 2018-03-25 DIAGNOSIS — O26899 Other specified pregnancy related conditions, unspecified trimester: Secondary | ICD-10-CM | POA: Diagnosis present

## 2018-03-25 DIAGNOSIS — O26893 Other specified pregnancy related conditions, third trimester: Secondary | ICD-10-CM | POA: Diagnosis not present

## 2018-03-25 NOTE — Discharge Instructions (Signed)
Come back if: ° °Big gush of fluids °Decreased fetal movement °Heavy vaginal bleeding °Temp over 100.4 °Contractions every 3-5 min lasting at least one hour ° °Get plenty of rest and stay well hydrated! °

## 2018-03-25 NOTE — Discharge Summary (Signed)
  Please see final progress note. 

## 2018-03-25 NOTE — Final Progress Note (Signed)
Physician Final Progress Note  Patient ID: Lori Velasquez MRN: [id number] DOB/AGE: Oct 27, 1991 26 y.o.  Admit date: 03/25/2018 Admitting provider: Natale Milchhristanna R Schuman, MD Discharge date: 03/25/2018   Admission Diagnoses: Pelvic Pain in Pregnancy  Discharge Diagnoses:  Active Problems:   Pelvic pressure in pregnancy    Consults: None  Significant Findings/ Diagnostic Studies: NST: Reactive category I. Baseline 135bpm, moderate variability, no decelerations, 15 x15 accelerations  Procedures: None  Hospital Course: Patient presented to the hospital with complaints of pelvic pressure. She reported that the pelvic pain and pressure had been present  Throughout the day. She rated it a 7/10. On cervical exam by nursing her cervix was dilated only a finger tip and was thick and long. She drank water and was hydrated orally and felt better and was able to be discharged home.    Discharge Condition: good  Disposition: Discharge disposition: 01-Home or Self Care       Diet: Regular diet  Discharge Activity: Activity as tolerated   Allergies as of 03/25/2018      Reactions   Peppermint Oil    Diphenhydramine Hcl    Told she had some reaction as a young child      Medication List    TAKE these medications   albuterol 108 (90 Base) MCG/ACT inhaler Commonly known as:  PROVENTIL HFA;VENTOLIN HFA Inhale 2 puffs into the lungs every 4 (four) hours as needed for wheezing or shortness of breath.   prenatal multivitamin Tabs tablet Take 1 tablet by mouth daily at 12 noon.        Total time spent taking care of this patient: 20 minutes  Signed: Christanna R Schuman 03/25/2018, 10:16 PM

## 2018-03-25 NOTE — OB Triage Note (Signed)
Recvd pt from ED. Pt c/o of cramping since 1430 this afternoon. Denies vaginal bleeding or LOF. Feeling baby move well. Receives iron infusions (last one on 4/16). Rates cramping pain a 7 out of 10.

## 2018-03-25 NOTE — Progress Notes (Signed)
  Subjective  Fetal Movement? yes Contractions? no Leaking Fluid? no Vaginal Bleeding? no  Objective  BP 130/80   Wt 176 lb (79.8 kg)   LMP 06/26/2017 (LMP Unknown)   BMI 35.55 kg/m  General: NAD Pumonary: no increased work of breathing Abdomen: gravid, non-tender Extremities: no edema Psychiatric: mood appropriate, affect full  Assessment  27 y.o. G2P1001 at 3858w6d by  04/02/2018, by Last Menstrual Period presenting for routine prenatal visit  Plan   Problem List Items Addressed This Visit      Other   History of cesarean delivery, currently pregnant   Short interval between pregnancies affecting pregnancy in first trimester, antepartum   Supervision of high risk pregnancy, antepartum   Pregnancy - Primary    CS next week, VBAC discussed again today if spon labor No OnQ desired  Annamarie MajorPaul Harris, MD, Merlinda FrederickFACOG [facility], [facility] 03/25/2018  4:33 PM

## 2018-03-31 ENCOUNTER — Ambulatory Visit (INDEPENDENT_AMBULATORY_CARE_PROVIDER_SITE_OTHER): Payer: Medicaid Other | Admitting: Obstetrics and Gynecology

## 2018-03-31 ENCOUNTER — Encounter: Payer: Self-pay | Admitting: Obstetrics and Gynecology

## 2018-03-31 ENCOUNTER — Encounter
Admission: RE | Admit: 2018-03-31 | Discharge: 2018-03-31 | Disposition: A | Discharge status: Home / Self Care | Payer: Medicaid Other | Source: Ambulatory Visit | Attending: Obstetrics and Gynecology | Admitting: Obstetrics and Gynecology

## 2018-03-31 ENCOUNTER — Other Ambulatory Visit: Payer: Self-pay

## 2018-03-31 VITALS — BP 122/72 | HR 106 | Ht 59.0 in | Wt 174.0 lb

## 2018-03-31 DIAGNOSIS — R8271 Bacteriuria: Secondary | ICD-10-CM | POA: Diagnosis not present

## 2018-03-31 DIAGNOSIS — O09893 Supervision of other high risk pregnancies, third trimester: Secondary | ICD-10-CM

## 2018-03-31 DIAGNOSIS — O34219 Maternal care for unspecified type scar from previous cesarean delivery: Secondary | ICD-10-CM

## 2018-03-31 DIAGNOSIS — D508 Other iron deficiency anemias: Secondary | ICD-10-CM | POA: Diagnosis not present

## 2018-03-31 DIAGNOSIS — O0990 Supervision of high risk pregnancy, unspecified, unspecified trimester: Secondary | ICD-10-CM | POA: Diagnosis not present

## 2018-03-31 DIAGNOSIS — Z3A39 39 weeks gestation of pregnancy: Secondary | ICD-10-CM | POA: Diagnosis not present

## 2018-03-31 LAB — TYPE AND SCREEN
ABO/RH(D): A POS
Antibody Screen: NEGATIVE
Extend sample reason: UNDETERMINED

## 2018-03-31 LAB — CBC
HCT: 34.3 % — ABNORMAL LOW (ref 35.0–47.0)
Hemoglobin: 10.7 g/dL — ABNORMAL LOW (ref 12.0–16.0)
MCH: 22.7 pg — ABNORMAL LOW (ref 26.0–34.0)
MCHC: 31.1 g/dL — AB (ref 32.0–36.0)
MCV: 72.8 fL — ABNORMAL LOW (ref 80.0–100.0)
PLATELETS: 162 10*3/uL (ref 150–440)
RBC: 4.71 MIL/uL (ref 3.80–5.20)
RDW: 30 % — AB (ref 11.5–14.5)
WBC: 7.1 10*3/uL (ref 3.6–11.0)

## 2018-03-31 MED ORDER — CEFAZOLIN SODIUM-DEXTROSE 2-4 GM/100ML-% IV SOLN
2.0000 g | INTRAVENOUS | Status: AC
  Administered 2018-04-01: 2 g via INTRAVENOUS
  Filled 2018-03-31 (×2): qty 100

## 2018-03-31 NOTE — Progress Notes (Signed)
 Patient ID: Lori Velasquez, female   DOB: 10/27/1991, 26 y.o.   MRN: [id number]  Reason for Consult: Pre-op Exam (H&P for surgery)   Referred by No ref. provider found  Subjective:     HPI:  Lori Velasquez is a 26 y.o. female she has been feeling well. She is scheduled for a repeat cesarean section tomorrow. She would not like a On-Q pain pump. She denies leakage of fluid. Denies vaginal bleeding. Mild contractions and pain, feeling better with rest. Has completed four iron transfusions.   OB History  Gravida Para Term Preterm AB Living  2 1 1     1  SAB TAB Ectopic Multiple Live Births          1    # Outcome Date GA Lbr Len/2nd Weight Sex Delivery Anes PTL Lv  2 Current           1 Term 03/10/17 [redacted]w[redacted]d  8 lb 12 oz (3.969 kg) M CS-Unspec   LIV   pregnancy 2 Problems (from 06/26/17 to present)    Problem Noted Resolved   GBS bacteriuria 08/11/2017 by Schmid, Jacelyn Y, CNM No   History of cesarean delivery, currently pregnant 08/07/2017 by Schmid, Jacelyn Y, CNM No   Overview Signed 10/05/2017  4:16 PM by Jackson, Stephen D, MD    [ ] Desires repeat - schedule early 3rd trim      Short interval between pregnancies affecting pregnancy in first trimester, antepartum 08/07/2017 by Schmid, Jacelyn Y, CNM No   Supervision of high risk pregnancy, antepartum 08/07/2017 by Schmid, Jacelyn Y, CNM No   Overview Addendum 03/04/2018  4:38 PM by Haiven Nardone R, MD    Clinic [facility]renatal Labs  Dating LMP Blood type: A/Positive/-- (08/31 1557)   Genetic Screen Declines Antibody:Negative (08/31 1557)  Anatomic US  Rubella:  Varicella:    GTT Early:               Third trimester: 105 RPR: Non Reactive (08/31 1557)   Rhogam  not needed HBsAg: Negative (08/31 1557)   TDaP vaccine                       Flu Shot: HIV:   neg  Baby Food    Breast/Bottle                        GBS: Positive  Contraception  Nexplanon Pap: Normal in 2016  CBB  Yes   CS/VBAC c/s   Support Person husband                   Past Medical History:  Diagnosis Date  . Anemia   . Asthma   . Heart murmur    cardiac US done during last pregnancy   . Iron deficiency anemia 03/11/2018   Family History  Problem Relation Age of Onset  . Anemia Mother   . Diabetes Maternal Grandfather    Past Surgical History:  Procedure Laterality Date  . CESAREAN SECTION    . surgery for esophagela atresia as infant      Short Social History:  Social History   Tobacco Use  . Smoking status: Never Smoker  . Smokeless tobacco: Never Used  Substance Use Topics  . Alcohol use: No    Allergies  Allergen Reactions  . Peppermint Oil   . Diphenhydramine Hcl     Told she had some reaction as   a young child    Current Outpatient Medications  Medication Sig Dispense Refill  . albuterol (PROVENTIL HFA;VENTOLIN HFA) 108 (90 Base) MCG/ACT inhaler Inhale 2 puffs into the lungs every 4 (four) hours as needed for wheezing or shortness of breath.    . Prenatal Vit-Fe Fumarate-FA (PRENATAL MULTIVITAMIN) TABS tablet Take 1 tablet by mouth daily at 12 noon.     No current facility-administered medications for this visit.     Review of Systems  Constitutional: Negative for chills, fatigue, fever and unexpected weight change.  HENT: Negative for trouble swallowing.  Eyes: Negative for loss of vision.  Respiratory: Negative for cough, shortness of breath and wheezing.  Cardiovascular: Negative for chest pain, leg swelling, palpitations and syncope.  GI: Negative for abdominal pain, blood in stool, diarrhea, nausea and vomiting.  GU: Negative for difficulty urinating, dysuria, frequency and hematuria.  Musculoskeletal: Negative for back pain, leg pain and joint pain.  Skin: Negative for rash.  Neurological: Negative for dizziness, headaches, light-headedness, numbness and seizures.  Psychiatric: Negative for behavioral problem, confusion, depressed mood and sleep disturbance.        Objective:  Objective    Vitals:   03/31/18 0903  BP: 122/72  Pulse: (!) 106  Weight: 174 lb (78.9 kg)  Height: 4' 11" (1.499 m)   Body mass index is 35.14 kg/m.  Physical Exam  Constitutional: She is oriented to person, place, and time. She appears well-developed and well-nourished.  HENT:  Head: Normocephalic and atraumatic.  Eyes: EOM are normal.  Cardiovascular: Normal rate, regular rhythm and normal heart sounds.  Pulmonary/Chest: Effort normal and breath sounds normal.  Abdominal: Soft.  Gravid, fundal height 39 cm.  FHR 140 Skin edema peau d'orange under belly button above prior incision.   Neurological: She is alert and oriented to person, place, and time.  Skin: Skin is warm and dry.  Psychiatric: She has a normal mood and affect. Her behavior is normal. Judgment and thought content normal.  Nursing note and vitals reviewed.        Assessment/Plan:    26 yo with history of prior LTCS at [redacted] weeks gestation, will plan for repeat cesarean section tomorrow. Discussed surgery risks and benefits and obtain consents for surgery and blood transfusion if needed.  Lamanda Rudder MD [facility], [facility] 03/31/18 9:28 AM    

## 2018-03-31 NOTE — H&P (View-Only) (Signed)
Patient ID: Lori PhoenixLori D Velasquez, female   DOB: December 13, 1990, 27 y.o.   MRN: [id number]  Reason for Consult: Pre-op Exam (H&P for surgery)   Referred by No ref. provider found  Subjective:     HPI:  Lori Velasquez is a 27 y.o. female she has been feeling well. She is scheduled for a repeat cesarean section tomorrow. She would not like a On-Q pain pump. She denies leakage of fluid. Denies vaginal bleeding. Mild contractions and pain, feeling better with rest. Has completed four iron transfusions.   OB History  Gravida Para Term Preterm AB Living  2 1 1     1   SAB TAB Ectopic Multiple Live Births          1    # Outcome Date GA Lbr Len/2nd Weight Sex Delivery Anes PTL Lv  2 Current           1 Term 03/10/17 [id number]  8 lb 12 oz (3.969 kg) M CS-Unspec   LIV   pregnancy 2 Problems (from 06/26/17 to present)    Problem Noted Resolved   GBS bacteriuria 08/11/2017 by Oswaldo ConroySchmid, Jacelyn Y, CNM No   History of cesarean delivery, currently pregnant 08/07/2017 by Oswaldo ConroySchmid, Jacelyn Y, CNM No   Overview Signed 10/05/2017  4:16 PM by Conard NovakJackson, Stephen D, MD    [ ]  Desires repeat - schedule early 3rd trim      Short interval between pregnancies affecting pregnancy in first trimester, antepartum 08/07/2017 by Oswaldo ConroySchmid, Jacelyn Y, CNM No   Supervision of high risk pregnancy, antepartum 08/07/2017 by Oswaldo ConroySchmid, Jacelyn Y, CNM No   Overview Addendum 03/04/2018  4:38 PM by Natale MilchSchuman, Cherica Heiden R, MD    Clinic [facility] Prenatal Labs  Dating LMP Blood type: A/Positive/-- (08/31 1557)   Genetic Screen Declines Antibody:Negative (08/31 1557)  Anatomic US  Rubella:  Varicella:    GTT Early:               Third trimester: 105 RPR: Non Reactive (08/31 1557)   Rhogam  not needed HBsAg: Negative (08/31 1557)   TDaP vaccine                       Flu Shot: HIV:   neg  Baby Food    Breast/Bottle                        GBS: Positive  Contraception  Nexplanon Pap: Normal in 2016  CBB  Yes   CS/VBAC c/s   Support Person husband                   Past Medical History:  Diagnosis Date  . Anemia   . Asthma   . Heart murmur    cardiac US done during last pregnancy   . Iron deficiency anemia 03/11/2018   Family History  Problem Relation Age of Onset  . Anemia Mother   . Diabetes Maternal Grandfather    Past Surgical History:  Procedure Laterality Date  . CESAREAN SECTION    . surgery for esophagela atresia as infant      Short Social History:  Social History   Tobacco Use  . Smoking status: Never Smoker  . Smokeless tobacco: Never Used  Substance Use Topics  . Alcohol use: No    Allergies  Allergen Reactions  . Peppermint Oil   . Diphenhydramine Hcl     Told she had some reaction as  a young child    Current Outpatient Medications  Medication Sig Dispense Refill  . albuterol (PROVENTIL HFA;VENTOLIN HFA) 108 (90 Base) MCG/ACT inhaler Inhale 2 puffs into the lungs every 4 (four) hours as needed for wheezing or shortness of breath.    . Prenatal Vit-Fe Fumarate-FA (PRENATAL MULTIVITAMIN) TABS tablet Take 1 tablet by mouth daily at 12 noon.     No current facility-administered medications for this visit.     Review of Systems  Constitutional: Negative for chills, fatigue, fever and unexpected weight change.  HENT: Negative for trouble swallowing.  Eyes: Negative for loss of vision.  Respiratory: Negative for cough, shortness of breath and wheezing.  Cardiovascular: Negative for chest pain, leg swelling, palpitations and syncope.  GI: Negative for abdominal pain, blood in stool, diarrhea, nausea and vomiting.  GU: Negative for difficulty urinating, dysuria, frequency and hematuria.  Musculoskeletal: Negative for back pain, leg pain and joint pain.  Skin: Negative for rash.  Neurological: Negative for dizziness, headaches, light-headedness, numbness and seizures.  Psychiatric: Negative for behavioral problem, confusion, depressed mood and sleep disturbance.        Objective:  Objective    Vitals:   03/31/18 0903  BP: 122/72  Pulse: (!) 106  Weight: 174 lb (78.9 kg)  Height: 4\' 11"  (1.499 m)   Body mass index is 35.14 kg/m.  Physical Exam  Constitutional: She is oriented to person, place, and time. She appears well-developed and well-nourished.  HENT:  Head: Normocephalic and atraumatic.  Eyes: EOM are normal.  Cardiovascular: Normal rate, regular rhythm and normal heart sounds.  Pulmonary/Chest: Effort normal and breath sounds normal.  Abdominal: Soft.  Gravid, fundal height 39 cm.  FHR 140 Skin edema peau d'orange under belly button above prior incision.   Neurological: She is alert and oriented to person, place, and time.  Skin: Skin is warm and dry.  Psychiatric: She has a normal mood and affect. Her behavior is normal. Judgment and thought content normal.  Nursing note and vitals reviewed.        Assessment/Plan:    27 yo with history of prior LTCS at [redacted] weeks gestation, will plan for repeat cesarean section tomorrow. Discussed surgery risks and benefits and obtain consents for surgery and blood transfusion if needed.  Adelene Idler MD [facility], [facility] 03/31/18 9:28 AM

## 2018-03-31 NOTE — Patient Instructions (Signed)
Your procedure is scheduled on: 04/01/18  0745 AM Report to  LABOR AND DELIVERY THIRD FLOOR   Remember: Instructions that are not followed completely may result in serious medical risk, up to and including death, or upon the discretion of your surgeon and anesthesiologist your surgery may need to be rescheduled.     _X__ 1. Do not eat food after midnight the night before your procedure.                 No gum chewing or hard candies. You may drink clear liquids up to 2 hours                 before you are scheduled to arrive for your surgery- DO not drink clear                 liquids within 2 hours of the start of your surgery.                 Clear Liquids include:  water, apple juice without pulp, clear carbohydrate                 drink such as Clearfast of Gartorade, Black Coffee or Tea (Do not add                 anything to coffee or tea).  __X__2.  On the morning of surgery brush your teeth with toothpaste and water, you                 may rinse your mouth with mouthwash if you wish.  Do not swallow any              toothpaste of mouthwash.     _X__ 3.  No Alcohol for 24 hours before or after surgery.   _X__ 4.  Do Not Smoke or use e-cigarettes For 24 Hours Prior to Your Surgery.                 Do not use any chewable tobacco products for at least 6 hours prior to                 surgery.  ____  5.  Bring all medications with you on the day of surgery if instructed.   _X___  6.  Notify your doctor if there is any change in your medical condition      (cold, fever, infections).     Do not wear jewelry, make-up, hairpins, clips or nail polish. Do not wear lotions, powders, or perfumes. You may wear deodorant. Do not shave 48 hours prior to surgery. Men may shave face and neck. Do not bring valuables to the hospital.    [facility] belongings or valuables.  Contacts, dentures or bridgework may not be worn into surgery. Leave  your suitcase in the car. After surgery it may be brought to your room. For patients admitted to the hospital, discharge time is determined by your treatment team.   Patients discharged the day of surgery will not be allowed to drive home.   Please read over the following fact sheets that you were given:   Surgical Site Infection Prevention    _X___ Take these medicines the morning of surgery with A SIP OF WATER:    1. NONE  2.   3.   4.  5.  6.  ____ Fleet Enema (as directed)   _X___ Use CHG Soap as directed  _X___ Use inhalers on the day of surgery   AND BRING  ____ Stop metformin 2 days prior to surgery    ____ Take 1/2 of usual insulin dose the night before surgery. No insulin the morning          of surgery.   ____ Stop Coumadin/Plavix/aspirin on   ____ Stop Anti-inflammatories on    ____ Stop supplements until after surgery.    ____ Bring C-Pap to the hospital.

## 2018-04-01 ENCOUNTER — Encounter
Admission: RE | Disposition: A | Discharge status: Home / Self Care | Payer: Self-pay | Source: Ambulatory Visit | Attending: Obstetrics and Gynecology

## 2018-04-01 ENCOUNTER — Inpatient Hospital Stay: Payer: Medicaid Other

## 2018-04-01 ENCOUNTER — Other Ambulatory Visit: Payer: Self-pay

## 2018-04-01 ENCOUNTER — Inpatient Hospital Stay
Admission: RE | Admit: 2018-04-01 | Discharge: 2018-04-03 | DRG: 787 | Disposition: A | Discharge status: Home / Self Care | Payer: Medicaid Other | Source: Ambulatory Visit | Attending: Obstetrics and Gynecology | Admitting: Obstetrics and Gynecology

## 2018-04-01 DIAGNOSIS — O0990 Supervision of high risk pregnancy, unspecified, unspecified trimester: Secondary | ICD-10-CM

## 2018-04-01 DIAGNOSIS — O99824 Streptococcus B carrier state complicating childbirth: Secondary | ICD-10-CM | POA: Diagnosis present

## 2018-04-01 DIAGNOSIS — O09891 Supervision of other high risk pregnancies, first trimester: Secondary | ICD-10-CM

## 2018-04-01 DIAGNOSIS — D62 Acute posthemorrhagic anemia: Secondary | ICD-10-CM | POA: Diagnosis not present

## 2018-04-01 DIAGNOSIS — O9081 Anemia of the puerperium: Secondary | ICD-10-CM | POA: Diagnosis not present

## 2018-04-01 DIAGNOSIS — O34219 Maternal care for unspecified type scar from previous cesarean delivery: Secondary | ICD-10-CM

## 2018-04-01 DIAGNOSIS — R8271 Bacteriuria: Secondary | ICD-10-CM

## 2018-04-01 DIAGNOSIS — J45909 Unspecified asthma, uncomplicated: Secondary | ICD-10-CM | POA: Diagnosis present

## 2018-04-01 DIAGNOSIS — Z3A40 40 weeks gestation of pregnancy: Secondary | ICD-10-CM | POA: Diagnosis not present

## 2018-04-01 DIAGNOSIS — Z98891 History of uterine scar from previous surgery: Secondary | ICD-10-CM

## 2018-04-01 DIAGNOSIS — Z3A39 39 weeks gestation of pregnancy: Secondary | ICD-10-CM | POA: Diagnosis not present

## 2018-04-01 DIAGNOSIS — O9952 Diseases of the respiratory system complicating childbirth: Secondary | ICD-10-CM | POA: Diagnosis present

## 2018-04-01 DIAGNOSIS — D508 Other iron deficiency anemias: Secondary | ICD-10-CM

## 2018-04-01 DIAGNOSIS — O34211 Maternal care for low transverse scar from previous cesarean delivery: Principal | ICD-10-CM | POA: Diagnosis present

## 2018-04-01 LAB — ABO/RH: ABO/RH(D): A POS

## 2018-04-01 SURGERY — Surgical Case
Anesthesia: Spinal

## 2018-04-01 MED ORDER — KETOROLAC TROMETHAMINE 30 MG/ML IJ SOLN
30.0000 mg | Freq: Four times a day (QID) | INTRAMUSCULAR | Status: AC
  Filled 2018-04-01: qty 1

## 2018-04-01 MED ORDER — MORPHINE SULFATE (PF) 0.5 MG/ML IJ SOLN
INTRAMUSCULAR | Status: AC
  Filled 2018-04-01: qty 10

## 2018-04-01 MED ORDER — ACETAMINOPHEN 325 MG PO TABS: 650.0000 mg | ORAL_TABLET | ORAL | Status: DC | PRN

## 2018-04-01 MED ORDER — SIMETHICONE 80 MG PO CHEW
80.0000 mg | CHEWABLE_TABLET | Freq: Three times a day (TID) | ORAL | Status: DC
  Administered 2018-04-01 – 2018-04-03 (×5): 80 mg via ORAL
  Filled 2018-04-01 (×4): qty 1

## 2018-04-01 MED ORDER — OXYCODONE HCL 5 MG PO TABS
10.0000 mg | ORAL_TABLET | ORAL | Status: DC | PRN
Start: 2018-04-01 — End: 2018-04-01

## 2018-04-01 MED ORDER — SIMETHICONE 80 MG PO CHEW: 80.0000 mg | CHEWABLE_TABLET | ORAL | Status: DC | PRN

## 2018-04-01 MED ORDER — NALBUPHINE HCL 10 MG/ML IJ SOLN: 5.0000 mg | Freq: Once | INTRAMUSCULAR | Status: DC | PRN

## 2018-04-01 MED ORDER — MEPERIDINE HCL 25 MG/ML IJ SOLN: 6.2500 mg | INTRAMUSCULAR | Status: DC | PRN

## 2018-04-01 MED ORDER — OXYTOCIN 10 UNIT/ML IJ SOLN
INTRAMUSCULAR | Status: AC
  Filled 2018-04-01: qty 4

## 2018-04-01 MED ORDER — LACTATED RINGERS IV SOLN
INTRAVENOUS | Status: DC
  Administered 2018-04-01: 1000 mL via INTRAVENOUS

## 2018-04-01 MED ORDER — OXYTOCIN 40 UNITS IN LACTATED RINGERS INFUSION - SIMPLE MED
2.5000 [IU]/h | INTRAVENOUS | Status: AC
  Filled 2018-04-01: qty 1000

## 2018-04-01 MED ORDER — METHYLERGONOVINE MALEATE 0.2 MG/ML IJ SOLN
INTRAMUSCULAR | Status: AC
  Filled 2018-04-01: qty 1

## 2018-04-01 MED ORDER — OXYTOCIN 40 UNITS IN LACTATED RINGERS INFUSION - SIMPLE MED
INTRAVENOUS | Status: DC | PRN
  Administered 2018-04-01 (×2): 1000 mL via INTRAVENOUS

## 2018-04-01 MED ORDER — MISOPROSTOL 200 MCG PO TABS
ORAL_TABLET | ORAL | Status: AC
  Filled 2018-04-01: qty 4

## 2018-04-01 MED ORDER — MENTHOL 3 MG MT LOZG
1.0000 | LOZENGE | OROMUCOSAL | Status: DC | PRN
  Filled 2018-04-01: qty 9

## 2018-04-01 MED ORDER — OXYTOCIN 40 UNITS IN LACTATED RINGERS INFUSION - SIMPLE MED
INTRAVENOUS | Status: AC
  Filled 2018-04-01: qty 1000

## 2018-04-01 MED ORDER — SODIUM CHLORIDE 0.9% FLUSH: 3.0000 mL | INTRAVENOUS | Status: DC | PRN

## 2018-04-01 MED ORDER — PRENATAL MULTIVITAMIN CH
1.0000 | ORAL_TABLET | Freq: Every day | ORAL | Status: DC
  Administered 2018-04-02: 1 via ORAL
  Filled 2018-04-01: qty 1

## 2018-04-01 MED ORDER — MORPHINE SULFATE (PF) 0.5 MG/ML IJ SOLN
INTRAMUSCULAR | Status: DC | PRN
  Administered 2018-04-01: .1 mg via EPIDURAL

## 2018-04-01 MED ORDER — ACETAMINOPHEN 325 MG PO TABS
650.0000 mg | ORAL_TABLET | Freq: Four times a day (QID) | ORAL | Status: AC
  Administered 2018-04-01 – 2018-04-02 (×4): 650 mg via ORAL
  Filled 2018-04-01 (×4): qty 2

## 2018-04-01 MED ORDER — NALOXONE HCL 0.4 MG/ML IJ SOLN: 0.4000 mg | INTRAMUSCULAR | Status: DC | PRN

## 2018-04-01 MED ORDER — ALBUTEROL SULFATE (2.5 MG/3ML) 0.083% IN NEBU: 2.5000 mg | INHALATION_SOLUTION | RESPIRATORY_TRACT | Status: DC | PRN

## 2018-04-01 MED ORDER — SIMETHICONE 80 MG PO CHEW
80.0000 mg | CHEWABLE_TABLET | ORAL | Status: DC
  Filled 2018-04-01: qty 1

## 2018-04-01 MED ORDER — LACTATED RINGERS IV SOLN
INTRAVENOUS | Status: DC
  Administered 2018-04-01 – 2018-04-02 (×2): via INTRAVENOUS

## 2018-04-01 MED ORDER — SOD CITRATE-CITRIC ACID 500-334 MG/5ML PO SOLN
30.0000 mL | ORAL | Status: AC
  Administered 2018-04-01: 30 mL via ORAL
  Filled 2018-04-01: qty 15

## 2018-04-01 MED ORDER — OXYCODONE-ACETAMINOPHEN 5-325 MG PO TABS: 1.0000 | ORAL_TABLET | ORAL | Status: DC | PRN

## 2018-04-01 MED ORDER — NALBUPHINE HCL 10 MG/ML IJ SOLN: 5.0000 mg | INTRAMUSCULAR | Status: DC | PRN

## 2018-04-01 MED ORDER — OXYCODONE HCL 5 MG PO TABS
5.0000 mg | ORAL_TABLET | ORAL | Status: DC | PRN
Start: 2018-04-01 — End: 2018-04-01

## 2018-04-01 MED ORDER — FENTANYL CITRATE (PF) 100 MCG/2ML IJ SOLN
INTRAMUSCULAR | Status: AC
  Filled 2018-04-01: qty 2

## 2018-04-01 MED ORDER — BUPIVACAINE IN DEXTROSE 0.75-8.25 % IT SOLN
INTRATHECAL | Status: AC
  Filled 2018-04-01: qty 2

## 2018-04-01 MED ORDER — ONDANSETRON HCL 4 MG/2ML IJ SOLN
INTRAMUSCULAR | Status: DC | PRN
  Administered 2018-04-01: 4 mg via INTRAVENOUS

## 2018-04-01 MED ORDER — CARBOPROST TROMETHAMINE 250 MCG/ML IM SOLN
INTRAMUSCULAR | Status: AC
  Filled 2018-04-01: qty 1

## 2018-04-01 MED ORDER — MEASLES, MUMPS & RUBELLA VAC ~~LOC~~ INJ
0.5000 mL | INJECTION | Freq: Once | SUBCUTANEOUS | Status: AC
  Administered 2018-04-03: 0.5 mL via SUBCUTANEOUS
  Filled 2018-04-01 (×2): qty 0.5

## 2018-04-01 MED ORDER — FENTANYL CITRATE (PF) 100 MCG/2ML IJ SOLN
INTRAMUSCULAR | Status: DC | PRN
  Administered 2018-04-01: 15 ug via INTRAVENOUS

## 2018-04-01 MED ORDER — OXYCODONE-ACETAMINOPHEN 5-325 MG PO TABS: 2.0000 | ORAL_TABLET | ORAL | Status: DC | PRN

## 2018-04-01 MED ORDER — BUPIVACAINE IN DEXTROSE 0.75-8.25 % IT SOLN
INTRATHECAL | Status: DC | PRN
  Administered 2018-04-01: 1.5 mL via INTRATHECAL

## 2018-04-01 MED ORDER — IBUPROFEN 600 MG PO TABS
600.0000 mg | ORAL_TABLET | Freq: Four times a day (QID) | ORAL | Status: DC
  Administered 2018-04-02 – 2018-04-03 (×4): 600 mg via ORAL
  Filled 2018-04-01 (×4): qty 1

## 2018-04-01 MED ORDER — ZOLPIDEM TARTRATE 5 MG PO TABS: 5.0000 mg | ORAL_TABLET | Freq: Every evening | ORAL | Status: DC | PRN

## 2018-04-01 MED ORDER — METHYLERGONOVINE MALEATE 0.2 MG/ML IJ SOLN
INTRAMUSCULAR | Status: DC | PRN
  Administered 2018-04-01: 0.2 mg via INTRAMUSCULAR

## 2018-04-01 MED ORDER — ONDANSETRON HCL 4 MG/2ML IJ SOLN: 4.0000 mg | Freq: Three times a day (TID) | INTRAMUSCULAR | Status: DC | PRN

## 2018-04-01 MED ORDER — ONDANSETRON HCL 4 MG/2ML IJ SOLN
INTRAMUSCULAR | Status: AC
  Filled 2018-04-01: qty 2

## 2018-04-01 MED ORDER — SODIUM CHLORIDE 0.9 % IV SOLN
INTRAVENOUS | Status: DC | PRN
  Administered 2018-04-01: 50 ug/min via INTRAVENOUS

## 2018-04-01 MED ORDER — FERROUS SULFATE 325 (65 FE) MG PO TABS
325.0000 mg | ORAL_TABLET | Freq: Two times a day (BID) | ORAL | Status: DC
  Administered 2018-04-01 – 2018-04-03 (×4): 325 mg via ORAL
  Filled 2018-04-01 (×4): qty 1

## 2018-04-01 MED ORDER — KETOROLAC TROMETHAMINE 30 MG/ML IJ SOLN
30.0000 mg | Freq: Four times a day (QID) | INTRAMUSCULAR | Status: AC
  Administered 2018-04-01 – 2018-04-02 (×4): 30 mg via INTRAVENOUS
  Filled 2018-04-01 (×3): qty 1

## 2018-04-01 MED ORDER — WITCH HAZEL-GLYCERIN EX PADS: 1.0000 "application " | MEDICATED_PAD | CUTANEOUS | Status: DC | PRN

## 2018-04-01 MED ORDER — DIBUCAINE 1 % RE OINT: 1.0000 "application " | TOPICAL_OINTMENT | RECTAL | Status: DC | PRN

## 2018-04-01 MED ORDER — COCONUT OIL OIL: 1.0000 "application " | TOPICAL_OIL | Status: DC | PRN

## 2018-04-01 MED ORDER — LACTATED RINGERS IV SOLN
INTRAVENOUS | Status: DC
  Administered 2018-04-01: 10:00:00 via INTRAVENOUS

## 2018-04-01 SURGICAL SUPPLY — 23 items
CANISTER SUCT 3000ML PPV (MISCELLANEOUS) ×2 IMPLANT
CHLORAPREP W/TINT 26ML (MISCELLANEOUS) ×4 IMPLANT
DERMABOND ADVANCED (GAUZE/BANDAGES/DRESSINGS) ×1
DERMABOND ADVANCED .7 DNX12 (GAUZE/BANDAGES/DRESSINGS) ×1 IMPLANT
DRSG OPSITE POSTOP 4X10 (GAUZE/BANDAGES/DRESSINGS) ×2 IMPLANT
ELECT CAUTERY BLADE 6.4 (BLADE) ×2 IMPLANT
ELECT REM PT RETURN 9FT ADLT (ELECTROSURGICAL) ×2
ELECTRODE REM PT RTRN 9FT ADLT (ELECTROSURGICAL) ×1 IMPLANT
GLOVE BIOGEL PI IND STRL 6.5 (GLOVE) ×1 IMPLANT
GLOVE BIOGEL PI INDICATOR 6.5 (GLOVE) ×1
GOWN STRL REUS W/ TWL LRG LVL3 (GOWN DISPOSABLE) ×1 IMPLANT
GOWN STRL REUS W/ TWL XL LVL3 (GOWN DISPOSABLE) ×2 IMPLANT
GOWN STRL REUS W/TWL LRG LVL3 (GOWN DISPOSABLE) ×1
GOWN STRL REUS W/TWL XL LVL3 (GOWN DISPOSABLE) ×2
NS IRRIG 1000ML POUR BTL (IV SOLUTION) ×2 IMPLANT
PACK C SECTION AR (MISCELLANEOUS) ×2 IMPLANT
PAD OB MATERNITY 4.3X12.25 (PERSONAL CARE ITEMS) ×2 IMPLANT
PAD PREP 24X41 OB/GYN DISP (PERSONAL CARE ITEMS) ×2 IMPLANT
SUT MNCRL AB 4-0 PS2 18 (SUTURE) ×2 IMPLANT
SUT PLAIN 3-0 (SUTURE) ×2 IMPLANT
SUT VIC AB 0 CT1 36 (SUTURE) ×8 IMPLANT
SUT VIC AB 2-0 CT1 36 (SUTURE) ×2 IMPLANT
SYR 30ML LL (SYRINGE) ×4 IMPLANT

## 2018-04-01 NOTE — Anesthesia Procedure Notes (Addendum)
Spinal  Patient location during procedure: OB Start time: 04/01/2018 10:36 AM Staffing Anesthesiologist: Alver FisherPenwarden, Amy, MD Performed: anesthesiologist  Preanesthetic Checklist Completed: patient identified, site marked, surgical consent, pre-op evaluation, timeout performed, IV checked, risks and benefits discussed and monitors and equipment checked Spinal Block Patient position: sitting Prep: ChloraPrep Patient monitoring: heart rate, continuous pulse ox and blood pressure Approach: midline Location: L4-5 Injection technique: single-shot Needle Needle type: Pencil-Tip and Introducer  Needle gauge: 24 G Needle length: 9 cm Additional Notes No paresthesia, free flowing CSF, pt tolerated procedure well

## 2018-04-01 NOTE — Anesthesia Preprocedure Evaluation (Signed)
Anesthesia Evaluation  Patient identified by MRN, date of birth, ID band Patient awake    Reviewed: Allergy & Precautions, NPO status , Patient's Chart, lab work & pertinent test results  History of Anesthesia Complications Negative for: history of anesthetic complications  Airway Mallampati: III  TM Distance: >3 FB Neck ROM: Full    Dental no notable dental hx.    Pulmonary asthma , neg sleep apnea, neg COPD,    breath sounds clear to auscultation- rhonchi (-) wheezing      Cardiovascular Exercise Tolerance: Good (-) hypertension(-) CAD and (-) Past MI  Rhythm:Regular Rate:Normal - Systolic murmurs and - Diastolic murmurs    Neuro/Psych negative neurological ROS  negative psych ROS   GI/Hepatic negative GI ROS, Neg liver ROS,   Endo/Other  negative endocrine ROSneg diabetes  Renal/GU negative Renal ROS     Musculoskeletal negative musculoskeletal ROS (+)   Abdominal (+) + obese, Gravid abdomen  Peds  Hematology  (+) anemia ,   Anesthesia Other Findings Past Medical History: No date: Anemia No date: Asthma No date: Heart murmur     Comment:  cardiac US done during last pregnancy  03/11/2018: Iron deficiency anemia   Reproductive/Obstetrics (+) Pregnancy                             Lab Results  Component Value Date   WBC 7.1 03/31/2018   HGB 10.7 (L) 03/31/2018   HCT 34.3 (L) 03/31/2018   MCV 72.8 (L) 03/31/2018   PLT 162 03/31/2018    Anesthesia Physical Anesthesia Plan  ASA: II  Anesthesia Plan: Spinal   Post-op Pain Management:    Induction:   PONV Risk Score and Plan: 2 and Ondansetron  Airway Management Planned: Natural Airway  Additional Equipment:   Intra-op Plan:   Post-operative Plan:   Informed Consent: I have reviewed the patients History and Physical, chart, labs and discussed the procedure including the risks, benefits and alternatives for the  proposed anesthesia with the patient or authorized representative who has indicated his/her understanding and acceptance.   Dental advisory given  Plan Discussed with: CRNA and Anesthesiologist  Anesthesia Plan Comments:         Anesthesia Quick Evaluation

## 2018-04-01 NOTE — Anesthesia Post-op Follow-up Note (Signed)
Anesthesia QCDR form completed.        

## 2018-04-01 NOTE — Op Note (Signed)
Cesarean Section Procedure Note Indications: prior cesarean section and term intrauterine pregnancy  Pre-operative Diagnosis: Intrauterine pregnancy 4629w6d ;  prior cesarean section and term intrauterine pregnancy Post-operative Diagnosis: same, delivered. Procedure: Low Transverse Cesarean Section Surgeon: Natale Milchhristanna R Brendaly Townsel MD Assistant(s): Thomasene MohairStephen Jackson MD A second surgeon was needed for this case because of lack of availability of any other skilled assistant Anesthesia: Spinal anesthesia Estimated Blood Loss:700 Complications: Thick Peritoneal adhesion to the uterus Disposition: PACU - hemodynamically stable. Condition: stable  Findings: A female infant in the cephalic presentation. Amniotic fluid - Clear  Birth weight 8lbs 12 oz g.  Apgars of 9 and 10.  Intact placenta with a three-vessel cord. Grossly normal uterus, tubes and ovaries bilaterally. No intraabdominal adhesions were noted. The uterus was adhered to the anterior peritoneum.  Procedure Details   The patient was taken to Operating Room, identified as the correct patient and the procedure verified as C-Section Delivery. A Time Out was held and the above information confirmed. After induction of anesthesia, the patient was draped and prepped in the usual sterile manner. A Pfannenstiel incision was made and carried down through the subcutaneous tissue to the fascia. Fascial incision was made and extended transversely with the Mayo scissors. The fascia was separated from the underlying rectus tissue superiorly and inferiorly. The peritoneum was identified and entered bluntly. Peritoneal incision was extended longitudinally. There were thick adhesion of the peritoneum to there uterus. The adhesions were freed with careful dissection using the Metzenbaum scissors. The utero-vesical peritoneal reflection was incised transversely and a bladder flap was created digitally.  A low transverse hysterotomy was made. The fetus was  delivered atraumatically. The umbilical cord was clamped x2 and cut and the infant was handed to the awaiting pediatricians. The placenta was removed intact and appeared normal with a 3-vessel cord.  The uterus was exteriorized and cleared of all clot and debris. The hysterotomy was closed with running sutures of 0 Vicryl suture. A second imbricating layer was placed with the same suture. Excellent hemostasis was observed. The uterus was returned to the abdomen. The pelvis was irrigated and again, excellent hemostasis was noted.  The peritoneum was closed using 2-0 vicryl. The rectus fascia was then reapproximated with running sutures of 0-vicryl. The subcutaneous tissues are then irrigated with saline and hemostasis assured.  Skin is then closed with 4-0 monocryl suture in a subcuticular fashion followed by skin adhesive. A dressing was applied to the skin incision. Instrument, sponge, and needle counts were correct prior to the abdominal closure and at the conclusion of the case.  The patient tolerated the procedure well and was transferred to the recovery room in stable condition.   Adelene Idlerhristanna Lenorris Karger MD [facility], [facility] 04/01/2018  12:36 PM

## 2018-04-01 NOTE — Transfer of Care (Signed)
Immediate Anesthesia Transfer of Care Note  Patient: Lori Velasquez  Procedure(s) Performed: CESAREAN SECTION (N/A )  Patient Location: PACU and Mother/Baby  Anesthesia Type:Spinal  Level of Consciousness: awake, alert  and oriented  Airway & Oxygen Therapy: Patient Spontanous Breathing  Post-op Assessment: Report given to RN  Post vital signs: Reviewed and stable  Last Vitals:  Vitals Value Taken Time  BP 110/72 04/01/2018 12:40 PM  Temp 36.6 C 04/01/2018 12:40 PM  Pulse    Resp    SpO2 100 % 04/01/2018 12:40 PM    Last Pain:  Vitals:   04/01/18 1240  TempSrc: Oral  PainSc:          Complications: No apparent anesthesia complications

## 2018-04-01 NOTE — Interval H&P Note (Signed)
History and Physical Interval Note:  04/01/2018 6:25 PM  Lori Velasquez  has presented today for surgery, with the diagnosis of repeat cesarean  The various methods of treatment have been discussed with the patient and family. After consideration of risks, benefits and other options for treatment, the patient has consented to  Procedure(s): CESAREAN SECTION (N/A) as a surgical intervention .  The patient's history has been reviewed, patient examined, no change in status, stable for surgery.  I have reviewed the patient's chart and labs.  Questions were answered to the patient's satisfaction.     Lori Velasquez

## 2018-04-02 ENCOUNTER — Encounter: Payer: Self-pay | Admitting: Obstetrics and Gynecology

## 2018-04-02 LAB — CBC
HEMATOCRIT: 25 % — AB (ref 35.0–47.0)
HEMOGLOBIN: 7.8 g/dL — AB (ref 12.0–16.0)
MCH: 22.9 pg — ABNORMAL LOW (ref 26.0–34.0)
MCHC: 31.1 g/dL — AB (ref 32.0–36.0)
MCV: 73.6 fL — AB (ref 80.0–100.0)
Platelets: 130 10*3/uL — ABNORMAL LOW (ref 150–440)
RBC: 3.4 MIL/uL — ABNORMAL LOW (ref 3.80–5.20)
RDW: 29.8 % — ABNORMAL HIGH (ref 11.5–14.5)
WBC: 7.5 10*3/uL (ref 3.6–11.0)

## 2018-04-02 NOTE — Progress Notes (Signed)
Patient ID: ESPERANZA MADRAZO, female   DOB: 1991/03/20, 27 y.o.   MRN: [id number]  Obstetric Postpartum/PostOperative Daily Progress Note Subjective:  27 y.o. [id number] POD#1 status post repeat cesarean delivery.  She is ambulating, is tolerating po, is voiding spontaneously.  Her pain is well controlled on PO pain medications. Her lochia is equal to menses.   Medications SCHEDULED MEDICATIONS  . ferrous sulfate  325 mg Oral BID WC  . ibuprofen  600 mg Oral Q6H  . measles, mumps and rubella vaccine  0.5 mL Subcutaneous Once  . prenatal multivitamin  1 tablet Oral Q1200  . simethicone  80 mg Oral TID PC  . simethicone  80 mg Oral Q24H    MEDICATION INFUSIONS  . lactated ringers 125 mL/hr at 04/02/18 0555    PRN MEDICATIONS  acetaminophen, albuterol, coconut oil, witch hazel-glycerin **AND** dibucaine, menthol-cetylpyridinium, ondansetron (ZOFRAN) IV, oxyCODONE-acetaminophen, oxyCODONE-acetaminophen, simethicone, zolpidem    Objective:   Vitals:   04/01/18 1920 04/01/18 2325 04/02/18 0310 04/02/18 0810  BP: 110/68 (!) 103/46 (!) 98/54 116/61  Pulse: 91 90 89 90  Resp: '18 18 18 20  ' Temp: 98.3 F (36.8 C) 98.2 F (36.8 C) 98.3 F (36.8 C) 98 F (36.7 C)  TempSrc: Oral Oral Oral Oral  SpO2: 100% 98% 100% 99%  Weight:      Height:        Current Vital Signs 24h Vital Sign Ranges  T 98 F (36.7 C) Temp  Avg: 98.1 F (36.7 C)  Min: 97.7 F (36.5 C)  Max: 98.4 F (36.9 C)  BP 116/61 BP  Min: 95/76  Max: 119/86  HR 90 Pulse  Avg: 92.5  Min: 83  Max: 105  RR 20 Resp  Avg: 19.5  Min: 17  Max: 28  SaO2 99 % Room Air SpO2  Avg: 99.3 %  Min: 94 %  Max: 100 %       24 Hour I/O Current Shift I/O  Time Ins Outs 04/25 0701 - 04/26 0700 In: 4674.6 [P.O.:1200; I.V.:3474.6] Out: 1825 [Urine:865] 04/26 0701 - 04/26 1900 In: 0  Out: 250 [Urine:250]  General: NAD Pulmonary: no increased work of breathing Abdomen: non-distended, non-tender, fundus firm at level of umbilicus Inc:  Clean/dry/intact Extremities: no edema, no erythema, no tenderness  Labs:  Recent Labs  Lab 03/31/18 1022 04/02/18 0731  WBC 7.1 7.5  HGB 10.7* 7.8*  HCT 34.3* 25.0*  PLT 162 130*     Assessment:   27 y.o. [id number] postoperative day # 1, s/p repeat cesarean section  Plan:  1) Acute blood loss anemia - hemodynamically stable and asymptomatic - po ferrous sulfate  2) A POS Performed at [facility], [address] [place], [place] [address]  / Charlynn Grimes <0.90 (08/31 1557) - MMR vaccine prior to discharge (ordered) / Varicella Immune  3) TDAP status: received 02/04/18, Influenza vaccine: up to date per patient  4) formula feeding /Contraception = nexplanon  5) Disposition: home POD#2-3  Will Bonnet, MD, [place] 04/02/2018 11:40 AM

## 2018-04-02 NOTE — Anesthesia Post-op Follow-up Note (Signed)
  Anesthesia Pain Follow-up Note  Patient: Lori Velasquez  Day #: 1  Date of Follow-up: 04/02/2018 Time: 8:03 AM  Last Vitals:  Vitals:   04/01/18 2325 04/02/18 0310  BP: (!) 103/46 (!) 98/54  Pulse: 90 89  Resp: 18 18  Temp: 36.8 C 36.8 C  SpO2: 98% 100%    Level of Consciousness: alert  Pain: none   Side Effects:None  Catheter Site Exam:clean, dry, no drainage     Plan: D/C from anesthesia care at surgeon's request  Karoline Caldwelleana Dream Nodal

## 2018-04-02 NOTE — Progress Notes (Signed)
Mother doing well this shift.  Assessment is WNL.  Incision dressing is clean, dry and intact with no drainage.  Mother with minor complaints of pain but refusing anything other than scheduled Motrin.  Mother showered independently this shift and ambulating independently in room.

## 2018-04-02 NOTE — Anesthesia Postprocedure Evaluation (Signed)
Anesthesia Post Note  Patient: Lori Velasquez  Procedure(s) Performed: CESAREAN SECTION (N/A )  Patient location during evaluation: Mother Baby Anesthesia Type: Spinal Level of consciousness: awake, awake and alert and oriented Pain management: pain level controlled Vital Signs Assessment: post-procedure vital signs reviewed and stable Respiratory status: spontaneous breathing Cardiovascular status: blood pressure returned to baseline Postop Assessment: no headache, no backache and no apparent nausea or vomiting Anesthetic complications: no     Last Vitals:  Vitals:   04/01/18 2325 04/02/18 0310  BP: (!) 103/46 (!) 98/54  Pulse: 90 89  Resp: 18 18  Temp: 36.8 C 36.8 C  SpO2: 98% 100%    Last Pain:  Vitals:   04/02/18 0310  TempSrc: Oral  PainSc: Asleep                 Karoline Caldwelleana Sheniah Supak

## 2018-04-03 MED ORDER — IBUPROFEN 600 MG PO TABS: 600.0000 mg | ORAL_TABLET | Freq: Four times a day (QID) | ORAL | 1 refills | Status: DC | PRN

## 2018-04-03 MED ORDER — OXYCODONE-ACETAMINOPHEN 5-325 MG PO TABS: 1.0000 | ORAL_TABLET | Freq: Four times a day (QID) | ORAL | 0 refills | Status: AC | PRN

## 2018-04-03 MED ORDER — DOCUSATE SODIUM 100 MG PO CAPS: 100.0000 mg | ORAL_CAPSULE | Freq: Two times a day (BID) | ORAL | 1 refills | Status: DC | PRN

## 2018-04-03 MED ORDER — FUSION 65-65-25-30 MG PO CAPS: 1.0000 | ORAL_CAPSULE | Freq: Every day | ORAL | 3 refills | Status: DC

## 2018-04-03 NOTE — Discharge Summary (Signed)
Physician Obstetric Discharge Summary  Patient ID: GLADIE GRAVETTE MRN: [id number] DOB/AGE: Sep 01, 1991 27 y.o.   Date of Admission: 04/01/2018  Date of Discharge: 04/03/2018  Admitting Diagnosis: Scheduled cesarean section at [redacted]w[redacted]d Secondary Diagnosis: Anemia in pregnancy  Mode of Delivery: repeat cesarean section 04/01/2018 low uterine, transverse     Discharge Diagnosis: Term intrauterine pregnancy-delivered by repeat Cesarean section, low transverse Cesarean section, iron deficiency anemia   Intrapartum Procedures: Atificial rupture of membranes   Post partum procedures: MMR  Complications: none   Brief Hospital Course   (Cesarean Section): LMARIELLEN BLANEYis a [id number] underwent low transverse cesarean section on 04/01/2018.  Patient had a surgery complicated by thick peritoneal adhesions to the uterus; for further details of this surgery, please refer to the operative note.  Patient had a postpartum course complicated by worsening of her anemia. Her POD #1 hemoglobin was 7.8gm/dl, down from 10.7 gm/dl by she was asymptomatic.  By time of discharge on POD#2, her pain was controlled on oral pain medications; she had appropriate lochia and was ambulating, voiding without difficulty, tolerating regular diet and passing flatus.   She was deemed stable for discharge to home.    Labs: CBC Latest Ref Rng & Units 04/02/2018 03/31/2018 03/11/2018  WBC 3.6 - 11.0 K/uL 7.5 7.1 9.8  Hemoglobin 12.0 - 16.0 g/dL 7.8(L) 10.7(L) 8.1(L)  Hematocrit 35.0 - 47.0 % 25.0(L) 34.3(L) 26.3(L)  Platelets 150 - 440 K/uL 130(L) 162 166   [facility] [address] [place] [id number]/ VI  Physical exam:  Blood pressure 113/60, pulse 100, temperature 97.8 F (36.6 C), temperature source Axillary, resp. rate 18, height _0  (1.499 m), weight 80.7 kg (178 lb), last menstrual period 06/26/2017, SpO2 100 %, unknown if currently breastfeeding. General: alert and no  distress, pale Heart: tachycardia with AP 1800 systolic murmur Grade 2/6 present in all areas Lochia: appropriate Abdomen: soft, NT, non distended, honey comb dressing C+D+I, BS active Uterine Fundus: firm/ U-1/ML/NT  Extremities: No evidence of DVT seen on physical exam. No lower extremity edema.  Discharge Instructions: Per After Visit Summary. Activity: Advance as tolerated. Pelvic rest for 6 weeks.  Also refer to Discharge Instructions Diet: Regular Medications: Allergies as of 04/03/2018      Reactions   Peppermint Oil    Diphenhydramine Hcl    Told she had some reaction as a young child      Medication List    TAKE these medications   albuterol 108 (90 Base) MCG/ACT inhaler Commonly known as:  PROVENTIL HFA;VENTOLIN HFA Inhale 2 puffs into the lungs every 4 (four) hours as needed for wheezing or shortness of breath.   docusate sodium 100 MG capsule Commonly known as:  COLACE Take 1 capsule (100 mg total) by mouth 2 (two) times daily as needed.   FUSION 65-65-25-30 MG Caps Take 1 tablet by mouth daily.   ibuprofen 600 MG tablet Commonly known as:  ADVIL,MOTRIN Take 1 tablet (600 mg total) by mouth every 6 (six) hours as needed for mild pain, moderate pain or cramping.   oxyCODONE-acetaminophen 5-325 MG tablet Commonly known as:  PERCOCET/ROXICET Take 1 tablet by mouth every 6 (six) hours as needed for up to 7 days for moderate pain or severe pain.   prenatal multivitamin Tabs tablet Take 1 tablet by mouth daily at 12 noon.      Outpatient follow up:  Postpartum contraception: Nexplanon  Discharged Condition: stable  Discharged to: home   Newborn Data: Jaxson/ 8#12 oz Disposition:home with mother  Apgars: APGAR (1 MIN): 9   APGAR (5 MINS): 10   APGAR (10 MINS):    Baby Feeding: Bottle  Dalia Heading, CNM 04/03/2018 9:52 AM

## 2018-04-03 NOTE — Progress Notes (Signed)
Pt discharged with infant.  Discharge instructions, prescriptions and follow up appointment given to and reviewed with pt. Pt verbalized understanding. Escorted out by auxillary. 

## 2018-04-07 ENCOUNTER — Ambulatory Visit (INDEPENDENT_AMBULATORY_CARE_PROVIDER_SITE_OTHER): Payer: Medicaid Other | Admitting: Obstetrics and Gynecology

## 2018-04-07 ENCOUNTER — Encounter: Payer: Self-pay | Admitting: Obstetrics and Gynecology

## 2018-04-07 VITALS — BP 118/74 | Ht 59.0 in | Wt 164.0 lb

## 2018-04-07 DIAGNOSIS — Z09 Encounter for follow-up examination after completed treatment for conditions other than malignant neoplasm: Secondary | ICD-10-CM

## 2018-04-07 NOTE — Progress Notes (Signed)
   Postoperative Follow-up Patient presents post op from cesarean section  1 week ago.  Subjective: She denies fever, chills, nausea and vomiting. Eating a regular diet without difficulty. The patient is not having any pain.  Activity: increasing appropriately. She denies issues with her incision.    Objective: BP 118/74   Ht  (1.499 m)   Wt 164 lb (74.4 kg)   BMI 33.12 kg/m   Constitutional: Well nourished, well developed female in no acute distress.  HEENT: normal Skin: Warm and dry.  Abdomen: soft, NT, ND, uterus at U-3 clean, dry, intact and without erythema, induration, warmth, and tenderness Extremity: no edema   Assessment: 27 y.o. s/p cesarean section progressing well  Plan: Patient has done well after surgery with no apparent complications.  I have discussed the post-operative course to date, and the expected progress moving forward.  The patient understands what complications to be concerned about.    Activity plan: increase slowly Wound care instructions reiterated.   Return in about 5 weeks (around 05/12/2018) for Six Week Postpartum, Nexplanon Placement with Dr. Jerene Pitch.  Thomasene Mohair, MD 04/08/2018 11:27 AM

## 2018-04-08 ENCOUNTER — Encounter: Payer: Self-pay | Admitting: Obstetrics and Gynecology

## 2018-04-20 ENCOUNTER — Inpatient Hospital Stay: Payer: Medicaid Other | Attending: Oncology | Admitting: Oncology

## 2018-04-20 DIAGNOSIS — Z79899 Other long term (current) drug therapy: Secondary | ICD-10-CM | POA: Insufficient documentation

## 2018-04-20 DIAGNOSIS — R5383 Other fatigue: Secondary | ICD-10-CM | POA: Insufficient documentation

## 2018-04-20 DIAGNOSIS — O99013 Anemia complicating pregnancy, third trimester: Secondary | ICD-10-CM | POA: Insufficient documentation

## 2018-04-20 DIAGNOSIS — D509 Iron deficiency anemia, unspecified: Secondary | ICD-10-CM | POA: Diagnosis not present

## 2018-04-20 DIAGNOSIS — R5381 Other malaise: Secondary | ICD-10-CM | POA: Diagnosis not present

## 2018-04-20 DIAGNOSIS — Z3A37 37 weeks gestation of pregnancy: Secondary | ICD-10-CM | POA: Diagnosis not present

## 2018-04-20 LAB — CBC WITH DIFFERENTIAL/PLATELET
Basophils Absolute: 0 10*3/uL (ref 0–0.1)
Basophils Relative: 0 %
EOS PCT: 1 %
Eosinophils Absolute: 0.1 10*3/uL (ref 0–0.7)
HCT: 33.5 % — ABNORMAL LOW (ref 35.0–47.0)
Hemoglobin: 10.5 g/dL — ABNORMAL LOW (ref 12.0–16.0)
LYMPHS PCT: 28 %
Lymphs Abs: 1.8 10*3/uL (ref 1.0–3.6)
MCH: 23.4 pg — ABNORMAL LOW (ref 26.0–34.0)
MCHC: 31.4 g/dL — ABNORMAL LOW (ref 32.0–36.0)
MCV: 74.7 fL — AB (ref 80.0–100.0)
MONO ABS: 0.4 10*3/uL (ref 0.2–0.9)
Monocytes Relative: 6 %
Neutro Abs: 4.2 10*3/uL (ref 1.4–6.5)
Neutrophils Relative %: 65 %
PLATELETS: 273 10*3/uL (ref 150–440)
RBC: 4.49 MIL/uL (ref 3.80–5.20)
RDW: 24.8 % — AB (ref 11.5–14.5)
WBC: 6.5 10*3/uL (ref 3.6–11.0)

## 2018-04-20 LAB — IRON AND TIBC
IRON: 24 ug/dL — AB (ref 28–170)
Saturation Ratios: 5 % — ABNORMAL LOW (ref 10.4–31.8)
TIBC: 444 ug/dL (ref 250–450)
UIBC: 420 ug/dL

## 2018-04-20 LAB — FERRITIN: FERRITIN: 22 ng/mL (ref 11–307)

## 2018-04-22 ENCOUNTER — Ambulatory Visit: Payer: Medicaid Other | Admitting: Oncology

## 2018-04-23 ENCOUNTER — Inpatient Hospital Stay: Payer: Medicaid Other

## 2018-04-23 ENCOUNTER — Inpatient Hospital Stay: Payer: Medicaid Other | Admitting: Oncology

## 2018-04-27 ENCOUNTER — Inpatient Hospital Stay (HOSPITAL_BASED_OUTPATIENT_CLINIC_OR_DEPARTMENT_OTHER): Payer: Medicaid Other | Admitting: Oncology

## 2018-04-27 ENCOUNTER — Inpatient Hospital Stay: Payer: Medicaid Other

## 2018-04-27 ENCOUNTER — Encounter: Payer: Self-pay | Admitting: Oncology

## 2018-04-27 VITALS — BP 96/61 | HR 82 | Temp 97.3°F | Resp 20

## 2018-04-27 VITALS — BP 97/65 | HR 83 | Temp 97.1°F | Resp 18 | Ht 59.0 in | Wt 151.0 lb

## 2018-04-27 DIAGNOSIS — D508 Other iron deficiency anemias: Secondary | ICD-10-CM

## 2018-04-27 DIAGNOSIS — D509 Iron deficiency anemia, unspecified: Secondary | ICD-10-CM

## 2018-04-27 DIAGNOSIS — Z3A37 37 weeks gestation of pregnancy: Secondary | ICD-10-CM

## 2018-04-27 DIAGNOSIS — Z79899 Other long term (current) drug therapy: Secondary | ICD-10-CM

## 2018-04-27 DIAGNOSIS — R5381 Other malaise: Secondary | ICD-10-CM

## 2018-04-27 DIAGNOSIS — O99013 Anemia complicating pregnancy, third trimester: Secondary | ICD-10-CM | POA: Diagnosis not present

## 2018-04-27 DIAGNOSIS — R5383 Other fatigue: Secondary | ICD-10-CM | POA: Diagnosis not present

## 2018-04-27 MED ORDER — SODIUM CHLORIDE 0.9 % IV SOLN
Freq: Once | INTRAVENOUS | Status: AC
  Administered 2018-04-27: 14:00:00 via INTRAVENOUS
  Filled 2018-04-27: qty 1000

## 2018-04-27 MED ORDER — IRON SUCROSE 20 MG/ML IV SOLN
200.0000 mg | Freq: Once | INTRAVENOUS | Status: AC
  Administered 2018-04-27: 200 mg via INTRAVENOUS
  Filled 2018-04-27: qty 10

## 2018-04-27 NOTE — Progress Notes (Signed)
Hematology/Oncology follow up note [facility] Telephone:([phone] [phone]   Patient Care Team: System, Pcp Not In as PCP - General  REFERRING PROVIDER: Dr.Schuman CHIEF COMPLAINTS/PURPOSE OF CONSULTATION:  Evaluation of iron deficiency anemia  HISTORY OF PRESENTING ILLNESS:  Lori Velasquez is a  27 y.o.  female with PMH listed below who was referred to me for evaluation of iron deficiency anemia.  She is 37 weeks of pregnant for her second child. Reports feeling fatigue, otherwise feeling well.  Denies hematochezia or black tarry stool.   INTERVAL HISTORY Lori Velasquez is a 27 y.o. female who has above history reviewed by me today presents for follow up visit for management of iron deficiency anemia.  S/p 4 doses of Venofer. During the interval she delivered a baby boy. Feels fatigue is better.    Review of Systems  Constitutional: Positive for malaise/fatigue. Negative for chills, fever and weight loss.  HENT: Negative for congestion, ear discharge, ear pain, nosebleeds, sinus pain and sore throat.   Eyes: Negative for double vision, photophobia, pain, discharge and redness.  Respiratory: Negative for cough, hemoptysis, sputum production, shortness of breath and wheezing.   Cardiovascular: Negative for chest pain, palpitations, orthopnea, claudication and leg swelling.  Gastrointestinal: Negative for abdominal pain, blood in stool, constipation, diarrhea, heartburn, melena, nausea and vomiting.  Genitourinary: Negative for dysuria, flank pain, frequency and hematuria.  Musculoskeletal: Negative for back pain, myalgias and neck pain.  Skin: Negative for itching and rash.  Neurological: Negative for dizziness, tingling, tremors, focal weakness, weakness and headaches.  Endo/Heme/Allergies: Negative for environmental allergies. Does not bruise/bleed easily.  Psychiatric/Behavioral: Negative for depression, hallucinations and suicidal ideas. The  patient is not nervous/anxious.     MEDICAL HISTORY:  Past Medical History:  Diagnosis Date  . Anemia   . Asthma   . Heart murmur    cardiac US done during last pregnancy   . Iron deficiency anemia 03/11/2018    SURGICAL HISTORY: Past Surgical History:  Procedure Laterality Date  . CESAREAN SECTION    . CESAREAN SECTION N/A 04/01/2018   Procedure: CESAREAN SECTION;  Surgeon: Natale Milch, MD;  Location: [facility] ORS;  Service: Obstetrics;  Laterality: N/A;  . surgery for esophagela atresia as infant      SOCIAL HISTORY: Social History   Socioeconomic History  . Marital status: Single    Spouse name: Not on file  . Number of children: Not on file  . Years of education: Not on file  . Highest education level: Not on file  Occupational History  . Not on file  Social Needs  . Financial resource strain: Not on file  . Food insecurity:    Worry: Not on file    Inability: Not on file  . Transportation needs:    Medical: Not on file    Non-medical: Not on file  Tobacco Use  . Smoking status: Never Smoker  . Smokeless tobacco: Never Used  Substance and Sexual Activity  . Alcohol use: No  . Drug use: No  . Sexual activity: Yes    Birth control/protection: None    Comment: plans for nexplanon after delivery  Lifestyle  . Physical activity:    Days per week: Not on file    Minutes per session: Not on file  . Stress: Not on file  Relationships  . Social connections:    Talks on phone: Not on file    Gets together: Not on file    Attends  religious service: Not on file    Active member of club or organization: Not on file    Attends meetings of clubs or organizations: Not on file    Relationship status: Not on file  . Intimate partner violence:    Fear of current or ex partner: Not on file    Emotionally abused: Not on file    Physically abused: Not on file    Forced sexual activity: Not on file  Other Topics Concern  . Not on file  Social History Narrative    . Not on file    FAMILY HISTORY: Family History  Problem Relation Age of Onset  . Anemia Mother   . Diabetes Maternal Grandfather     ALLERGIES:  is allergic to peppermint oil and diphenhydramine hcl.  MEDICATIONS:  Current Outpatient Medications  Medication Sig Dispense Refill  . Prenatal Vit-Fe Fumarate-FA (PRENATAL MULTIVITAMIN) TABS tablet Take 1 tablet by mouth daily at 12 noon.    Marland Kitchen albuterol (PROVENTIL HFA;VENTOLIN HFA) 108 (90 Base) MCG/ACT inhaler Inhale 2 puffs into the lungs every 4 (four) hours as needed for wheezing or shortness of breath.    . docusate sodium (COLACE) 100 MG capsule Take 1 capsule (100 mg total) by mouth 2 (two) times daily as needed. (Patient not taking: Reported on 04/27/2018) 60 capsule 1  . Fe Fum-Fe Poly-Vit C-Lactobac (FUSION) 65-65-25-30 MG CAPS Take 1 tablet by mouth daily. (Patient not taking: Reported on 04/27/2018) 30 capsule 3  . ibuprofen (ADVIL,MOTRIN) 600 MG tablet Take 1 tablet (600 mg total) by mouth every 6 (six) hours as needed for mild pain, moderate pain or cramping. (Patient not taking: Reported on 04/27/2018) 30 tablet 1   No current facility-administered medications for this visit.      PHYSICAL EXAMINATION: ECOG PERFORMANCE STATUS: 0 - Asymptomatic Vitals:   04/27/18 1013  BP: 97/65  Pulse: 83  Resp: 18  Temp: (!) 97.1 F (36.2 C)  SpO2: 100%   Filed Weights   04/27/18 1013  Weight: 151 lb (68.5 kg)    Physical Exam  Constitutional: She is oriented to person, place, and time. She appears well-developed and well-nourished. No distress.  HENT:  Head: Normocephalic and atraumatic.  Right Ear: External ear normal.  Left Ear: External ear normal.  Mouth/Throat: Oropharynx is clear and moist.  Eyes: Pupils are equal, round, and reactive to light. Conjunctivae and EOM are normal. No scleral icterus.  Neck: Normal range of motion. Neck supple.  Cardiovascular: Normal rate, regular rhythm and normal heart sounds.   Pulmonary/Chest: Effort normal and breath sounds normal. No respiratory distress. She has no wheezes. She has no rales. She exhibits no tenderness.  Abdominal: Soft. Bowel sounds are normal. She exhibits no distension and no mass. There is no tenderness.  Musculoskeletal: Normal range of motion. She exhibits no edema or deformity.  Lymphadenopathy:    She has no cervical adenopathy.  Neurological: She is alert and oriented to person, place, and time. No cranial nerve deficit. Coordination normal.  Skin: Skin is warm and dry. No rash noted.  Psychiatric: She has a normal mood and affect. Her behavior is normal. Thought content normal.     LABORATORY DATA:  I have reviewed the data as listed Lab Results  Component Value Date   WBC 6.5 04/20/2018   HGB 10.5 (L) 04/20/2018   HCT 33.5 (L) 04/20/2018   MCV 74.7 (L) 04/20/2018   PLT 273 04/20/2018   No results for input(s): NA, K, CL,  CO2, GLUCOSE, BUN, CREATININE, CALCIUM, GFRNONAA, GFRAA, PROT, ALBUMIN, AST, ALT, ALKPHOS, BILITOT, BILIDIR, IBILI in the last 8760 hours.     ASSESSMENT & PLAN:  1. Microcytic anemia   2. Other iron deficiency anemia    Iron panel reviewed and discussed with patient. Ferritin level improved, however, still low saturation.  Plan additional  IV venofer weekly x 4 . All questions were answered. The patient knows to call the clinic with any problems questions or concerns.  Return of visit: 2 months and repeat labs.   Thank you for this kind referral and the opportunity to participate in the care of this patient. A copy of today's note is routed to referring provider    Rickard Patience, MD, PhD Hematology Oncology [facility] Pager- [phone] 04/27/2018

## 2018-04-27 NOTE — Progress Notes (Signed)
No new changes noted today 

## 2018-05-06 ENCOUNTER — Inpatient Hospital Stay: Payer: Medicaid Other

## 2018-05-06 VITALS — BP 102/65 | HR 79 | Temp 98.1°F | Resp 18 | Wt 151.0 lb

## 2018-05-06 DIAGNOSIS — D508 Other iron deficiency anemias: Secondary | ICD-10-CM

## 2018-05-06 DIAGNOSIS — O99013 Anemia complicating pregnancy, third trimester: Secondary | ICD-10-CM | POA: Diagnosis not present

## 2018-05-06 MED ORDER — SODIUM CHLORIDE 0.9 % IV SOLN
Freq: Once | INTRAVENOUS | Status: AC
  Administered 2018-05-06: 14:00:00 via INTRAVENOUS
  Filled 2018-05-06: qty 1000

## 2018-05-06 MED ORDER — IRON SUCROSE 20 MG/ML IV SOLN
200.0000 mg | Freq: Once | INTRAVENOUS | Status: AC
  Administered 2018-05-06: 200 mg via INTRAVENOUS
  Filled 2018-05-06: qty 10

## 2018-05-13 ENCOUNTER — Inpatient Hospital Stay: Payer: Medicaid Other | Attending: Oncology

## 2018-05-13 VITALS — BP 98/60 | HR 73 | Temp 98.3°F | Resp 18 | Wt 151.0 lb

## 2018-05-13 DIAGNOSIS — D508 Other iron deficiency anemias: Secondary | ICD-10-CM

## 2018-05-13 DIAGNOSIS — O9081 Anemia of the puerperium: Secondary | ICD-10-CM | POA: Diagnosis present

## 2018-05-13 DIAGNOSIS — D509 Iron deficiency anemia, unspecified: Secondary | ICD-10-CM | POA: Diagnosis not present

## 2018-05-13 MED ORDER — IRON SUCROSE 20 MG/ML IV SOLN
200.0000 mg | Freq: Once | INTRAVENOUS | Status: AC
  Administered 2018-05-13: 200 mg via INTRAVENOUS
  Filled 2018-05-13: qty 10

## 2018-05-13 MED ORDER — SODIUM CHLORIDE 0.9 % IV SOLN
Freq: Once | INTRAVENOUS | Status: AC
  Administered 2018-05-13: 14:00:00 via INTRAVENOUS
  Filled 2018-05-13: qty 1000

## 2018-05-14 ENCOUNTER — Ambulatory Visit: Payer: Medicaid Other | Admitting: Obstetrics and Gynecology

## 2018-05-20 ENCOUNTER — Inpatient Hospital Stay: Payer: Medicaid Other

## 2018-05-20 VITALS — BP 104/68 | HR 74 | Temp 98.5°F | Resp 16

## 2018-05-20 DIAGNOSIS — O9081 Anemia of the puerperium: Secondary | ICD-10-CM | POA: Diagnosis not present

## 2018-05-20 DIAGNOSIS — D508 Other iron deficiency anemias: Secondary | ICD-10-CM

## 2018-05-20 MED ORDER — IRON SUCROSE 20 MG/ML IV SOLN
200.0000 mg | Freq: Once | INTRAVENOUS | Status: AC
  Administered 2018-05-20: 200 mg via INTRAVENOUS
  Filled 2018-05-20: qty 10

## 2018-05-20 MED ORDER — SODIUM CHLORIDE 0.9 % IV SOLN
Freq: Once | INTRAVENOUS | Status: AC
  Administered 2018-05-20: 14:00:00 via INTRAVENOUS
  Filled 2018-05-20: qty 1000

## 2018-05-25 ENCOUNTER — Ambulatory Visit (INDEPENDENT_AMBULATORY_CARE_PROVIDER_SITE_OTHER): Payer: Medicaid Other | Admitting: Obstetrics and Gynecology

## 2018-05-25 ENCOUNTER — Encounter: Payer: Self-pay | Admitting: Obstetrics and Gynecology

## 2018-05-25 VITALS — BP 110/70 | HR 68 | Ht 59.0 in | Wt 146.0 lb

## 2018-05-25 DIAGNOSIS — Z98891 History of uterine scar from previous surgery: Secondary | ICD-10-CM

## 2018-05-25 DIAGNOSIS — Z09 Encounter for follow-up examination after completed treatment for conditions other than malignant neoplasm: Secondary | ICD-10-CM

## 2018-05-25 DIAGNOSIS — O9081 Anemia of the puerperium: Secondary | ICD-10-CM

## 2018-05-25 DIAGNOSIS — Z30017 Encounter for initial prescription of implantable subdermal contraceptive: Secondary | ICD-10-CM

## 2018-05-25 MED ORDER — ETONOGESTREL 68 MG ~~LOC~~ IMPL: 68.0000 mg | DRUG_IMPLANT | Freq: Once | SUBCUTANEOUS | Status: DC

## 2018-05-25 NOTE — Progress Notes (Signed)
  OBSTETRICS POSTPARTUM CLINIC PROGRESS NOTE  Subjective:     Lori Velasquez is a 27 y.o. [id number] female who presents for a postpartum visit. She is 6 weeks postpartum following a Term pregnancy and delivery by C-section.  I have fully reviewed the prenatal and intrapartum course. Anesthesia: spinal.  Postpartum course has been complicated by complicated by anemia.  Baby is feeding by Bottle.  Bleeding: patient has  resumed menses. She is on her period today Bowel function is normal. Bladder function is normal.  Patient is not sexually active. Contraception method desired is Nexplanon.  Postpartum depression screening: negative. Edinburgh 0.  The following portions of the patient's history were reviewed and updated as appropriate: allergies, current medications, past family history, past medical history, past social history, past surgical history and problem list.  Review of Systems Pertinent items are noted in HPI.  Objective:    BP 110/70   Pulse 68   Ht 4\' 11"  (1.499 m)   Wt 146 lb (66.2 kg)   LMP 05/24/2018 (Exact Date)   Breastfeeding? No   BMI 29.49 kg/m    Physical Exam  Constitutional: She is oriented to person, place, and time. She appears well-developed and well-nourished.  HENT:  Head: Normocephalic and atraumatic.  Eyes: EOM are normal.  Cardiovascular: Normal rate, regular rhythm and normal heart sounds.  Pulmonary/Chest: Effort normal and breath sounds normal.  Abdominal: Soft. She exhibits no distension and no mass. There is no tenderness. There is no guarding.  Well healed pfannenstiel incision  Neurological: She is alert and oriented to person, place, and time.  Skin: Skin is warm and dry.  Psychiatric: She has a normal mood and affect. Her behavior is normal. Judgment and thought content normal.  Nursing note and vitals reviewed.  Nexplanon Insertion  Patient given informed consent, signed copy in the chart, time out was performed. She is on her period  today. Appropriate time out taken.  Patient's left arm was prepped and draped in the usual sterile fashion. The ruler used to measure and mark insertion area.  Pt was prepped with betadine swab and then injected with 3 cc of 2% lidocaine with epinephrine. Nexplanon removed form packaging,  Device confirmed in needle, then inserted full length of needle and withdrawn per handbook instructions.  Pt insertion site covered with steri-strip and a bandage.   Minimal blood loss.  Pt tolerated the procedure well.   Assessment:  Post Partum Care visit There are no diagnoses linked to this encounter.  Plan:  See orders and Patient Instructions Follow up in: 1 month or as needed.   Adelene Idlerhristanna Schuman MD [facility], [facility] 05/25/18 4:29 PM

## 2018-05-25 NOTE — Patient Instructions (Signed)
Etonogestrel implant What is this medicine? ETONOGESTREL (et oh noe JES trel) is a contraceptive (birth control) device. It is used to prevent pregnancy. It can be used for up to 3 years. This medicine may be used for other purposes; ask your health care provider or pharmacist if you have questions. COMMON BRAND NAME(S): Implanon, Nexplanon What should I tell my health care provider before I take this medicine? They need to know if you have any of these conditions: -abnormal vaginal bleeding -blood vessel disease or blood clots -cancer of the breast, cervix, or liver -depression -diabetes -gallbladder disease -headaches -heart disease or recent heart attack -high blood pressure -high cholesterol -kidney disease -liver disease -renal disease -seizures -tobacco smoker -an unusual or allergic reaction to etonogestrel, other hormones, anesthetics or antiseptics, medicines, foods, dyes, or preservatives -pregnant or trying to get pregnant -breast-feeding How should I use this medicine? This device is inserted just under the skin on the inner side of your upper arm by a health care professional. Talk to your pediatrician regarding the use of this medicine in children. Special care may be needed. Overdosage: If you think you have taken too much of this medicine contact a poison control center or emergency room at once. NOTE: This medicine is only for you. Do not share this medicine with others. What if I miss a dose? This does not apply. What may interact with this medicine? Do not take this medicine with any of the following medications: -amprenavir -bosentan -fosamprenavir This medicine may also interact with the following medications: -barbiturate medicines for inducing sleep or treating seizures -certain medicines for fungal infections like ketoconazole and itraconazole -grapefruit juice -griseofulvin -medicines to treat seizures like carbamazepine, felbamate, oxcarbazepine,  phenytoin, topiramate -modafinil -phenylbutazone -rifampin -rufinamide -some medicines to treat HIV infection like atazanavir, indinavir, lopinavir, nelfinavir, tipranavir, ritonavir -St. John's wort This list may not describe all possible interactions. Give your health care provider a list of all the medicines, herbs, non-prescription drugs, or dietary supplements you use. Also tell them if you smoke, drink alcohol, or use illegal drugs. Some items may interact with your medicine. What should I watch for while using this medicine? This product does not protect you against HIV infection (AIDS) or other sexually transmitted diseases. You should be able to feel the implant by pressing your fingertips over the skin where it was inserted. Contact your doctor if you cannot feel the implant, and use a non-hormonal birth control method (such as condoms) until your doctor confirms that the implant is in place. If you feel that the implant may have broken or become bent while in your arm, contact your healthcare provider. What side effects may I notice from receiving this medicine? Side effects that you should report to your doctor or health care professional as soon as possible: -allergic reactions like skin rash, itching or hives, swelling of the face, lips, or tongue -breast lumps -changes in emotions or moods -depressed mood -heavy or prolonged menstrual bleeding -pain, irritation, swelling, or bruising at the insertion site -scar at site of insertion -signs of infection at the insertion site such as fever, and skin redness, pain or discharge -signs of pregnancy -signs and symptoms of a blood clot such as breathing problems; changes in vision; chest pain; severe, sudden headache; pain, swelling, warmth in the leg; trouble speaking; sudden numbness or weakness of the face, arm or leg -signs and symptoms of liver injury like dark yellow or brown urine; general ill feeling or flu-like symptoms;  light-colored   stools; loss of appetite; nausea; right upper belly pain; unusually weak or tired; yellowing of the eyes or skin -unusual vaginal bleeding, discharge -signs and symptoms of a stroke like changes in vision; confusion; trouble speaking or understanding; severe headaches; sudden numbness or weakness of the face, arm or leg; trouble walking; dizziness; loss of balance or coordination Side effects that usually do not require medical attention (report to your doctor or health care professional if they continue or are bothersome): -acne -back pain -breast pain -changes in weight -dizziness -general ill feeling or flu-like symptoms -headache -irregular menstrual bleeding -nausea -sore throat -vaginal irritation or inflammation This list may not describe all possible side effects. Call your doctor for medical advice about side effects. You may report side effects to FDA at 1-800-FDA-1088. Where should I keep my medicine? This drug is given in a hospital or clinic and will not be stored at home. NOTE: This sheet is a summary. It may not cover all possible information. If you have questions about this medicine, talk to your doctor, pharmacist, or health care provider.  2018 Elsevier/Gold Standard (2016-06-12 11:19:22) Nexplanon Instructions After Insertion   Keep bandage clean and dry for 24 hours   May use ice/Tylenol/Ibuprofen for soreness or pain   If you develop fever, drainage or increased warmth from incision site-contact office immediately   

## 2018-06-24 ENCOUNTER — Ambulatory Visit: Payer: Medicaid Other | Admitting: Obstetrics and Gynecology

## 2018-06-25 ENCOUNTER — Inpatient Hospital Stay: Payer: Medicaid Other | Attending: Oncology

## 2018-06-28 ENCOUNTER — Inpatient Hospital Stay: Payer: Medicaid Other | Admitting: Oncology

## 2018-06-28 ENCOUNTER — Telehealth: Payer: Self-pay | Admitting: Oncology

## 2018-06-28 ENCOUNTER — Inpatient Hospital Stay: Payer: Medicaid Other

## 2018-06-28 NOTE — Telephone Encounter (Signed)
Left pt a VM that we did not need to see her today due to labs not being drawn on 7/19. Informed pt to call office to r\s lab appt 1-2 days prior to follow-up with Dr. Cathie HoopsYu.

## 2018-06-29 ENCOUNTER — Ambulatory Visit (INDEPENDENT_AMBULATORY_CARE_PROVIDER_SITE_OTHER): Payer: Medicaid Other | Admitting: Obstetrics and Gynecology

## 2018-06-29 ENCOUNTER — Encounter: Payer: Self-pay | Admitting: Obstetrics and Gynecology

## 2018-06-29 VITALS — BP 104/62 | HR 79 | Ht 59.0 in | Wt 154.0 lb

## 2018-06-29 DIAGNOSIS — Z978 Presence of other specified devices: Secondary | ICD-10-CM

## 2018-06-29 DIAGNOSIS — Z975 Presence of (intrauterine) contraceptive device: Secondary | ICD-10-CM

## 2018-06-29 DIAGNOSIS — N921 Excessive and frequent menstruation with irregular cycle: Secondary | ICD-10-CM | POA: Diagnosis not present

## 2018-06-29 NOTE — Progress Notes (Signed)
Patient ID: Lori Velasquez, female   DOB: [birth year], 27 y.o.   MRN: [id number]  Reason for Consult: Follow-up (Nexplanon f/u no concerns )   Referred by No ref. provider found  Subjective:     HPI:  Lori Velasquez is a 27 y.o. female . Nexplanon inserted about 1 month ago. She has been having vaginal spotting throughout the month and is not completely happy with the Nexplanon as a form of birth control.   Past Medical History:  Diagnosis Date  . Anemia   . Asthma   . Heart murmur    cardiac US done during last pregnancy   . Iron deficiency anemia 03/11/2018   Family History  Problem Relation Age of Onset  . Anemia Mother   . Diabetes Maternal Grandfather    Past Surgical History:  Procedure Laterality Date  . CESAREAN SECTION    . CESAREAN SECTION N/A 04/01/2018   Procedure: CESAREAN SECTION;  Surgeon: Natale Milch, MD;  Location: [facility] ORS;  Service: Obstetrics;  Laterality: N/A;  . surgery for esophagela atresia as infant      Short Social History:  Social History   Tobacco Use  . Smoking status: Never Smoker  . Smokeless tobacco: Never Used  Substance Use Topics  . Alcohol use: No    Allergies  Allergen Reactions  . Peppermint Oil   . Diphenhydramine Hcl     Told she had some reaction as a young child    No current outpatient medications on file.   Current Facility-Administered Medications  Medication Dose Route Frequency Provider Last Rate Last Dose  . etonogestrel (NEXPLANON) implant 68 mg  68 mg Subdermal Once Schuman, Christanna R, MD        Review of Systems  Constitutional: Negative for chills, fatigue, fever and unexpected weight change.  HENT: Negative for trouble swallowing.  Eyes: Negative for loss of vision.  Respiratory: Negative for cough, shortness of breath and wheezing.  Cardiovascular: Negative for chest pain, leg swelling, palpitations and syncope.  GI: Negative for abdominal pain, blood in stool, diarrhea, nausea and vomiting.   GU: Negative for difficulty urinating, dysuria, frequency and hematuria.  Musculoskeletal: Negative for back pain, leg pain and joint pain.  Skin: Negative for rash.  Neurological: Negative for dizziness, headaches, light-headedness, numbness and seizures.  Psychiatric: Negative for behavioral problem, confusion, depressed mood and sleep disturbance.        Objective:  Objective   Vitals:   06/29/18 1628  BP: 104/62  Pulse: 79  Weight: 154 lb (69.9 kg)  Height: 4\' 11"  (1.499 m)   Body mass index is 31.1 kg/m.  Physical Exam  Constitutional: She is oriented to person, place, and time. She appears well-developed and well-nourished.  HENT:  Head: Normocephalic and atraumatic.  Eyes: Pupils are equal, round, and reactive to light. EOM are normal.  Cardiovascular: Normal rate and regular rhythm.  Pulmonary/Chest: Effort normal. No respiratory distress.  Abdominal: Soft. She exhibits no distension.  Musculoskeletal:  Nexplanon in proper position in left arm. Palpated.   Neurological: She is alert and oriented to person, place, and time.  Skin: Skin is warm and dry.  Psychiatric: She has a normal mood and affect. Her behavior is normal. Judgment and thought content normal.  Nursing note and vitals reviewed.        Assessment/Plan:    27 yo here for a nexplanon check She will trial the nexplanon for up to one year to decide if she wants  to continue to use it as her birth control method. She may be interested in switching to Depo Provera.   More than 15 minutes were spent face to face with the patient in the room with more than 50% of the time spent providing counseling and discussing the plan of management. We discussed irregular bleeding management options with the nexplanon.    Follow up for annual exam in 6-12 months.   Adelene Idlerhristanna Schuman MD [facility], [facility] 06/29/18 5:36 PM

## 2018-07-23 ENCOUNTER — Other Ambulatory Visit (HOSPITAL_COMMUNITY)
Admission: RE | Admit: 2018-07-23 | Discharge: 2018-07-23 | Disposition: A | Discharge status: Home / Self Care | Payer: Medicaid Other | Source: Ambulatory Visit | Attending: Obstetrics and Gynecology | Admitting: Obstetrics and Gynecology

## 2018-07-23 ENCOUNTER — Ambulatory Visit (INDEPENDENT_AMBULATORY_CARE_PROVIDER_SITE_OTHER): Payer: Medicaid Other | Admitting: Obstetrics and Gynecology

## 2018-07-23 ENCOUNTER — Encounter: Payer: Self-pay | Admitting: Obstetrics and Gynecology

## 2018-07-23 VITALS — BP 120/64 | HR 97 | Ht 59.0 in | Wt 153.0 lb

## 2018-07-23 DIAGNOSIS — Z3046 Encounter for surveillance of implantable subdermal contraceptive: Secondary | ICD-10-CM

## 2018-07-23 DIAGNOSIS — Z3049 Encounter for surveillance of other contraceptives: Secondary | ICD-10-CM

## 2018-07-23 DIAGNOSIS — Z124 Encounter for screening for malignant neoplasm of cervix: Secondary | ICD-10-CM | POA: Insufficient documentation

## 2018-07-23 DIAGNOSIS — Z30015 Encounter for initial prescription of vaginal ring hormonal contraceptive: Secondary | ICD-10-CM

## 2018-07-23 MED ORDER — ETONOGESTREL-ETHINYL ESTRADIOL 0.12-0.015 MG/24HR VA RING: VAGINAL_RING | VAGINAL | 4 refills | Status: DC

## 2018-07-23 MED ORDER — ETONOGESTREL-ETHINYL ESTRADIOL 0.12-0.015 MG/24HR VA RING: VAGINAL_RING | VAGINAL | 11 refills | Status: DC

## 2018-07-23 NOTE — Progress Notes (Signed)
Patient ID: Lori Velasquez, female   DOB: 08/15/1991, 27 y.o.   MRN: [id number]  Reason for Consult: Contraception (Nexplanon removal/ would like pill)   Referred by No ref. provider found  Subjective:     HPI:  Lori PhoenixLori D Magowan is a 27 y.o. female she is not happy with her Nexplanon. It is causing irregular bleeding and uterine cramping. She would like to switch to a Nuva ring.   Past Medical History:  Diagnosis Date  . Anemia   . Asthma   . Heart murmur    cardiac US done during last pregnancy   . Iron deficiency anemia 03/11/2018   Family History  Problem Relation Age of Onset  . Anemia Mother   . Diabetes Maternal Grandfather    Past Surgical History:  Procedure Laterality Date  . CESAREAN SECTION    . CESAREAN SECTION N/A 04/01/2018   Procedure: CESAREAN SECTION;  Surgeon: Natale MilchSchuman, Pauleen Goleman R, MD;  Location: [facility] ORS;  Service: Obstetrics;  Laterality: N/A;  . surgery for esophagela atresia as infant      Short Social History:  Social History   Tobacco Use  . Smoking status: Never Smoker  . Smokeless tobacco: Never Used  Substance Use Topics  . Alcohol use: No    Allergies  Allergen Reactions  . Peppermint Oil   . Diphenhydramine Hcl     Told she had some reaction as a young child    Current Outpatient Medications  Medication Sig Dispense Refill  . etonogestrel-ethinyl estradiol (NUVARING) 0.12-0.015 MG/24HR vaginal ring Insert vaginally and leave in place for 3 consecutive weeks, then remove for 1 week. 1 each 11   Current Facility-Administered Medications  Medication Dose Route Frequency Provider Last Rate Last Dose  . etonogestrel (NEXPLANON) implant 68 mg  68 mg Subdermal Once Kailene Steinhart R, MD        Review of Systems  Constitutional: Negative for chills, fatigue, fever and unexpected weight change.  HENT: Negative for trouble swallowing.  Eyes: Negative for loss of vision.  Respiratory: Negative for cough, shortness of breath and  wheezing.  Cardiovascular: Negative for chest pain, leg swelling, palpitations and syncope.  GI: Negative for abdominal pain, blood in stool, diarrhea, nausea and vomiting.  GU: Negative for difficulty urinating, dysuria, frequency and hematuria.  Musculoskeletal: Negative for back pain, leg pain and joint pain.  Skin: Negative for rash.  Neurological: Negative for dizziness, headaches, light-headedness, numbness and seizures.  Psychiatric: Negative for behavioral problem, confusion, depressed mood and sleep disturbance.        Objective:  Objective   Vitals:   07/23/18 1606  BP: 120/64  Pulse: 97  Weight: 153 lb (69.4 kg)  Height: 4\' 11"  (1.499 m)   Body mass index is 30.9 kg/m.  Physical Exam  Constitutional: She is oriented to person, place, and time. She appears well-developed and well-nourished.  HENT:  Head: Normocephalic and atraumatic.  Eyes: Pupils are equal, round, and reactive to light. EOM are normal.  Cardiovascular: Normal rate and regular rhythm.  Pulmonary/Chest: Effort normal. No respiratory distress.  Genitourinary: Vagina normal and uterus normal. No vaginal discharge found.  Neurological: She is alert and oriented to person, place, and time.  Skin: Skin is warm and dry.  Psychiatric: She has a normal mood and affect. Her behavior is normal. Judgment and thought content normal.  Nursing note and vitals reviewed.   Nexplanon removal Procedure note - The Nexplanon was noted in the patient's arm and the end  was identified. The skin was cleansed with a Betadine solution. A small injection of subcutaneous lidocaine with epinephrine was given over the end of the implant. An incision was made at the end of the implant. The rod was noted in the incision and grasped with a hemostat. It was noted to be intact.  Steri-Strip was placed approximating the incision. Hemostasis was noted.     Assessment/Plan:     27 yo here for Nexplanon removal and birth control  consultation.  Pap smear today. Nexplanon removed, she will transition to nuva ring. Prescription for [employer] sent.  More than 25 minutes were spent face to face with the patient in the room with more than 50% of the time spent providing counseling and discussing the plan of management. We discussed her option for birth control management after nexplanon removal.     Adelene Idlerhristanna Royal Beirne MD [facility], [facility] 07/23/18 4:37 PM

## 2018-07-27 LAB — CYTOLOGY - PAP: DIAGNOSIS: NEGATIVE

## 2018-07-27 NOTE — Progress Notes (Signed)
Please call patient and let her know that her pap smear was normal. She will need her next one in 3 years.

## 2018-08-02 NOTE — Progress Notes (Signed)
Pt aware.

## 2018-10-25 ENCOUNTER — Encounter: Payer: Self-pay | Admitting: Obstetrics and Gynecology

## 2018-10-25 ENCOUNTER — Ambulatory Visit (INDEPENDENT_AMBULATORY_CARE_PROVIDER_SITE_OTHER): Payer: Self-pay | Admitting: Obstetrics and Gynecology

## 2018-10-25 VITALS — BP 112/58 | Ht 59.0 in | Wt 167.0 lb

## 2018-10-25 DIAGNOSIS — O34219 Maternal care for unspecified type scar from previous cesarean delivery: Secondary | ICD-10-CM

## 2018-10-25 DIAGNOSIS — N898 Other specified noninflammatory disorders of vagina: Secondary | ICD-10-CM

## 2018-10-25 DIAGNOSIS — O09899 Supervision of other high risk pregnancies, unspecified trimester: Secondary | ICD-10-CM | POA: Insufficient documentation

## 2018-10-25 DIAGNOSIS — O0990 Supervision of high risk pregnancy, unspecified, unspecified trimester: Secondary | ICD-10-CM

## 2018-10-25 DIAGNOSIS — Z3A08 8 weeks gestation of pregnancy: Secondary | ICD-10-CM

## 2018-10-25 DIAGNOSIS — Z23 Encounter for immunization: Secondary | ICD-10-CM

## 2018-10-25 NOTE — Progress Notes (Signed)
NOB No concerns  Flu shot today

## 2018-10-25 NOTE — Progress Notes (Signed)
10/25/2018   Chief Complaint: Missed period  Transfer of Care Patient: no  History of Present Illness: Lori Velasquez is a 27 y.o. G3P2002 [redacted]w[redacted]d based on Patient's last menstrual period was 08/28/2018 (exact date). with an Estimated Date of Delivery: 06/04/19, with the above CC.   Her periods were: regular periods every 28 days She was using Nuva Ring, but forgot to put it back in after her period when she conceived.  She has Negative signs or symptoms of nausea/vomiting of pregnancy. She has Negative signs or symptoms of miscarriage or preterm labor She identifies Negative Zika risk factors for her and her partner On any different medications around the time she conceived/early pregnancy: No  History of varicella: No   ROS: A 12-point review of systems was performed and negative, except as stated in the above HPI.  OBGYN History: As per HPI. OB History  Gravida Para Term Preterm AB Living  3 2 2     2   SAB TAB Ectopic Multiple Live Births        0 2    # Outcome Date GA Lbr Len/2nd Weight Sex Delivery Anes PTL Lv  3 Current           2 Term 04/01/18 [redacted]w[redacted]d  8 lb 12 oz (3.969 kg) M CS-LTranv Spinal  LIV  1 Term 03/10/17 [redacted]w[redacted]d  8 lb 12 oz (3.969 kg) M CS-Unspec   LIV    Any issues with any prior pregnancies: no Any prior children are healthy, doing well, without any problems or issues: yes History of pap smears: Yes. Last pap smear 2019 NIL History of STIs: No   Past Medical History: Past Medical History:  Diagnosis Date  . Anemia   . Asthma   . Heart murmur    cardiac US done during last pregnancy   . Iron deficiency anemia 03/11/2018  . Short interval between pregnancies affecting pregnancy in first trimester, antepartum 08/07/2017  . Supervision of high risk pregnancy, antepartum 08/07/2017   Clinic [facility] Prenatal Labs Dating LMP Blood type: A/Positive/-- (08/31 1557)  Genetic Screen Declines Antibody:Negative (08/31 1557) Anatomic [place]  Rubella:  Varicella:   GTT Early:                Third trimester: 105 RPR: Non Reactive (08/31 1557)  Rhogam  not needed HBsAg: Negative (08/31 1557)  TDaP vaccine                       Flu Shot: HIV:   neg Baby Food    Breast/Bottle                       Past Surgical History: Past Surgical History:  Procedure Laterality Date  . CESAREAN SECTION    . CESAREAN SECTION N/A 04/01/2018   Procedure: CESAREAN SECTION;  Surgeon: Natale Milch, MD;  Location: [facility] ORS;  Service: Obstetrics;  Laterality: N/A;  . surgery for esophagela atresia as infant      Family History:  Family History  Problem Relation Age of Onset  . Anemia Mother   . Diabetes Maternal Grandfather    She denies any female cancers, bleeding or blood clotting disorders.  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 She denies any history of mental retardation,  Other birth defects or genetic disorders in her or the FOB's history  Social History:  Social History   Socioeconomic History  . Marital status: Single  Spouse name: Not on file  . Number of children: Not on file  . Years of education: Not on file  . Highest education level: Not on file  Occupational History  . Not on file  Social Needs  . Financial resource strain: Not on file  . Food insecurity:    Worry: Not on file    Inability: Not on file  . Transportation needs:    Medical: Not on file    Non-medical: Not on file  Tobacco Use  . Smoking status: Never Smoker  . Smokeless tobacco: Never Used  Substance and Sexual Activity  . Alcohol use: No  . Drug use: No  . Sexual activity: Yes    Birth control/protection: Implant  Lifestyle  . Physical activity:    Days per week: Not on file    Minutes per session: Not on file  . Stress: Not on file  Relationships  . Social connections:    Talks on phone: Not on file    Gets together: Not on file    Attends religious service: Not on file    Active member of club or organization: Not on file    Attends meetings of clubs  or organizations: Not on file    Relationship status: Not on file  . Intimate partner violence:    Fear of current or ex partner: Not on file    Emotionally abused: Not on file    Physically abused: Not on file    Forced sexual activity: Not on file  Other Topics Concern  . Not on file  Social History Narrative  . Not on file   Any pets in the household: no cats in the house   Allergy: Allergies  Allergen Reactions  . Peppermint Oil   . Diphenhydramine Hcl     Told she had some reaction as a young child    Current Outpatient Medications:  Current Outpatient Medications:  .  Prenatal MV-Min-FA-Omega-3 (PRENATAL GUMMIES/DHA & FA) 0.4-32.5 MG CHEW, Chew by mouth., Disp: , Rfl:  .  etonogestrel-ethinyl estradiol (NUVARING) 0.12-0.015 MG/24HR vaginal ring, Insert vaginally and leave in place for 3 consecutive weeks, then remove for 1 week. (Patient not taking: Reported on 10/25/2018), Disp: 1 each, Rfl: 4  Current Facility-Administered Medications:  .  etonogestrel (NEXPLANON) implant 68 mg, 68 mg, Subdermal, Once, Adrien Dietzman R, MD   Physical Exam:   BP (!) 112/58   Wt 167 lb (75.8 kg)   LMP 08/28/2018 (Exact Date)   BMI 33.73 kg/m  Body mass index is 33.73 kg/m. Constitutional: Well nourished, well developed female in no acute distress.  Neck:  Supple, normal appearance, and no thyromegaly  Cardiovascular: S1, S2 normal, no murmur, rub or gallop, regular rate and rhythm Respiratory:  Clear to auscultation bilateral. Normal respiratory effort Abdomen: positive bowel sounds and no masses, hernias; diffusely non tender to palpation, non distended Breasts: breasts appear normal, no suspicious masses, no skin or nipple changes or axillary nodes. Neuro/Psych:  Normal mood and affect.  Skin:  Warm and dry.  Lymphatic:  No inguinal lymphadenopathy.   Pelvic exam: is limited by body habitus EGBUS: within normal limits, Vagina: within normal limits and with no blood in  the vault, Cervix: normal appearing cervix without discharge or lesions, closed/long/high, Uterus:  nonenlarged, and Adnexa:  normal adnexa and no mass, fullness, tenderness  Assessment: Lori Velasquez is a 27 y.o. G3P2002 3911w2d based on Patient's last menstrual period was 08/28/2018 (exact date). with an Estimated Date  of Delivery: 06/04/19,  for prenatal care.  Plan:  1) Avoid alcoholic beverages. 2) Patient encouraged not to smoke.  3) Discontinue the use of all non-medicinal drugs and chemicals.  4) Take prenatal vitamins daily.  5) Seatbelt use advised 6) Nutrition, food safety (fish, cheese advisories, and high nitrite foods) and exercise discussed. 7) Hospital and practice style delivering at [facility] discussed  8) Patient is asked about travel to areas at risk for the Zika virus, and counseled to avoid travel and exposure to mosquitoes or sexual partners who may have themselves been exposed to the virus. Testing is discussed, and will be ordered as appropriate.  9) Childbirth classes at [facility] advised 10) Genetic Screening, such as with 1st Trimester Screening, cell free fetal DNA, AFP testing, and Ultrasound, as well as with amniocentesis and CVS as appropriate, is discussed with patient. She plans to not have genetic testing this pregnancy.  Flu shot today Having some vaginal itching, no discharge on exam. No skin changes. Normal wet prep. Nuswab sent for confirmation  Problem list reviewed and updated.  Adelene Idler MD [facility], [facility] 10/25/18 2:31 PM

## 2018-10-25 NOTE — Addendum Note (Signed)
Addended by: Desmond DikeINDALL, Keyairra Kolinski on: 10/25/2018 03:56 PM   Modules accepted: Kipp BroodSmartSet

## 2018-10-26 LAB — RPR+RH+ABO+RUB AB+AB SCR+CB...
Antibody Screen: NEGATIVE
HEMATOCRIT: 44.2 % (ref 34.0–46.6)
HEMOGLOBIN: 14.2 g/dL (ref 11.1–15.9)
HIV Screen 4th Generation wRfx: NONREACTIVE
Hepatitis B Surface Ag: NEGATIVE
MCH: 27.4 pg (ref 26.6–33.0)
MCHC: 32.1 g/dL (ref 31.5–35.7)
MCV: 85 fL (ref 79–97)
Platelets: 309 10*3/uL (ref 150–450)
RBC: 5.19 x10E6/uL (ref 3.77–5.28)
RDW: 12.5 % (ref 12.3–15.4)
RH TYPE: POSITIVE
RPR Ser Ql: NONREACTIVE
RUBELLA: 6.3 {index} (ref >0.99)
Varicella zoster IgG: 245 index (ref >165)
WBC: 10.4 10*3/uL (ref 3.4–10.8)

## 2018-10-26 LAB — MONITOR DRUG PROFILE 10(MW)
AMPHETAMINE SCREEN URINE: NEGATIVE ng/mL
BARBITURATE SCREEN URINE: NEGATIVE ng/mL
BENZODIAZEPINE SCREEN, URINE: NEGATIVE ng/mL
CANNABINOIDS UR QL SCN: NEGATIVE ng/mL
COCAINE(METAB.)SCREEN, URINE: NEGATIVE ng/mL
Creatinine(Crt), U: 127.1 mg/dL (ref 20.0–300.0)
Methadone Screen, Urine: NEGATIVE ng/mL
OXYCODONE+OXYMORPHONE UR QL SCN: NEGATIVE ng/mL
Opiate Scrn, Ur: NEGATIVE ng/mL
PHENCYCLIDINE QUANTITATIVE URINE: NEGATIVE ng/mL
Ph of Urine: 6.2 (ref 4.5–8.9)
Propoxyphene Scrn, Ur: NEGATIVE ng/mL

## 2018-10-27 LAB — NUSWAB VAGINITIS PLUS (VG+)
CANDIDA ALBICANS, NAA: NEGATIVE
CHLAMYDIA TRACHOMATIS, NAA: NEGATIVE
Candida glabrata, NAA: NEGATIVE
Neisseria gonorrhoeae, NAA: NEGATIVE
Trich vag by NAA: NEGATIVE

## 2018-10-28 LAB — URINE CULTURE

## 2018-11-01 ENCOUNTER — Ambulatory Visit (INDEPENDENT_AMBULATORY_CARE_PROVIDER_SITE_OTHER): Payer: Medicaid Other | Admitting: Obstetrics and Gynecology

## 2018-11-01 ENCOUNTER — Encounter: Payer: Self-pay | Admitting: Obstetrics and Gynecology

## 2018-11-01 ENCOUNTER — Ambulatory Visit (INDEPENDENT_AMBULATORY_CARE_PROVIDER_SITE_OTHER): Payer: Self-pay

## 2018-11-01 VITALS — BP 118/74 | Wt 168.0 lb

## 2018-11-01 DIAGNOSIS — O09899 Supervision of other high risk pregnancies, unspecified trimester: Secondary | ICD-10-CM

## 2018-11-01 DIAGNOSIS — Z3A01 Less than 8 weeks gestation of pregnancy: Secondary | ICD-10-CM

## 2018-11-01 DIAGNOSIS — O34219 Maternal care for unspecified type scar from previous cesarean delivery: Secondary | ICD-10-CM

## 2018-11-01 DIAGNOSIS — O0990 Supervision of high risk pregnancy, unspecified, unspecified trimester: Secondary | ICD-10-CM

## 2018-11-01 DIAGNOSIS — N8311 Corpus luteum cyst of right ovary: Secondary | ICD-10-CM

## 2018-11-01 DIAGNOSIS — O3411 Maternal care for benign tumor of corpus uteri, first trimester: Secondary | ICD-10-CM

## 2018-11-01 DIAGNOSIS — R8271 Bacteriuria: Secondary | ICD-10-CM

## 2018-11-01 DIAGNOSIS — O9989 Other specified diseases and conditions complicating pregnancy, childbirth and the puerperium: Secondary | ICD-10-CM

## 2018-11-01 MED ORDER — AMOXICILLIN 250 MG/5ML PO SUSR: 500.0000 mg | Freq: Two times a day (BID) | ORAL | 0 refills | Status: AC

## 2018-11-01 NOTE — Progress Notes (Signed)
Routine Prenatal Care Visit  Subjective  Lori Velasquez is a 27 y.o. G3P2002 at [redacted]w[redacted]d being seen today for ongoing prenatal care.  She is currently monitored for the following issues for this high-risk pregnancy and has Iron deficiency anemia; Supervision of high risk pregnancy, antepartum; Short interval between pregnancies affecting pregnancy, antepartum; and History of cesarean delivery, antepartum on their problem list.  ----------------------------------------------------------------------------------- Patient reports no complaints.   Contractions: Not present. Vag. Bleeding: None.   . Denies leaking of fluid.  U/S changes EDD. Small SCH noted.  ----------------------------------------------------------------------------------- The following portions of the patient's history were reviewed and updated as appropriate: allergies, current medications, past family history, past medical history, past social history, past surgical history and problem list. Problem list updated.   Objective  Blood pressure 118/74, weight 168 lb (76.2 kg), last menstrual period 08/28/2018, not currently breastfeeding. Pregravid weight 167 lb (75.8 kg) Total Weight Gain 1 lb (0.454 kg) Urinalysis: Urine Protein    Urine Glucose    Fetal Status: Fetal Heart Rate (bpm): present         General:  Alert, oriented and cooperative. Patient is in no acute distress.  Skin: Skin is warm and dry. No rash noted.   Cardiovascular: Normal heart rate noted  Respiratory: Normal respiratory effort, no problems with respiration noted  Abdomen: Soft, gravid, appropriate for gestational age. Pain/Pressure: Absent     Pelvic:  Cervical exam deferred        Extremities: Normal range of motion.     Mental Status: Normal mood and affect. Normal behavior. Normal judgment and thought content.   Imaging Results: US Ob Comp Less 14 Wks  Result Date: 11/01/2018 Patient Name: Lori Velasquez DOB: 18-Jul-1991 MRN: [id number] ULTRASOUND  REPORT Location: [facility] Date of Service: 11/01/2018 Indications:dating Findings: Mason Jim intrauterine pregnancy is visualized with a CRL consistent with [redacted]w[redacted]d gestation, giving an (U/S) EDD of 06/19/2019. The (U/S) EDD is not consistent with the clinically established EDD of 06/04/2019. FHR: 157 BPM CRL measurement: 10.6 mm Yolk sac is visualized and appears normal and early anatomy is normal. Amnion: visualized and appears normal Small SCH 1.6 x 2.2 x 1.7cm Calcified area seen within the uterus superior/left 2.0 x 1.8cm. Right Ovary is normal in appearance. Left Ovary is normal appearance. Corpus luteal cyst:  Right ovary Survey of the adnexa demonstrates no adnexal masses. There is no free peritoneal fluid in the cul de sac. Impression: 1. [redacted]w[redacted]d Viable Singleton Intrauterine pregnancy by U/S. 2. (U/S) EDD is not consistent with Clinically established EDD. [place] EDD of 06/19/2019. 3. Small subchorionic hemorrhage. Recommendations: 1.Clinical correlation with the patient's History and Physical Exam. 2. Follow up ultrasound in 2 weeks to assess subchorionic hemorrhage. Darlina Guys, RDMS RVT There is a viable singleton gestation.  Detailed evaluation of the fetal anatomy is precluded by early gestational age.  It must be noted that a normal ultrasound particular at this early gestational age is unable to rule out fetal aneuploidy, risk of first trimester miscarriage, or anatomic birth defects. Thomasene Mohair, MD, [facility], [facility] 11/01/2018 2:27 PM      Assessment   27 y.o. [id number] at [redacted]w[redacted]d by  06/19/2019, by Ultrasound presenting for routine prenatal visit  Plan   Pregnancy #3 Problems (from 08/28/18 to present)    Problem Noted Resolved   Supervision of high risk pregnancy, antepartum 10/25/2018 by Natale Milch, MD No   Overview Addendum 10/25/2018  2:36 PM by Natale Milch,  MD      Clinic [facility] Prenatal Labs  Dating  Blood type: A POS   Genetic Screen  Declines Antibody:NEG (04/24 1022)  Anatomic US  Rubella:   Varicella: @VZVIGG @  GTT Early:        28 wk:      RPR: Non Reactive (01/31 1539)   Rhogam  not needed HBsAg:     TDaP vaccine                       HIV: Non Reactive (01/31 1539)   Flu Shot   10/25/18                             GBS:   Contraception  tubal ligation  [ ]  sign BTL consents Pap: 2019 NIL  CBB     CS/VBAC  desires RLTCS   Baby Food     Support Person   FOB: Geologist, engineeringJeffery Mother: Dorita FrayKimberly            Short interval between pregnancies affecting pregnancy, antepartum 10/25/2018 by Natale MilchSchuman, Christanna R, MD No   History of cesarean delivery, antepartum 10/25/2018 by Natale MilchSchuman, Christanna R, MD No       Preterm labor symptoms and general obstetric precautions including but not limited to vaginal bleeding, contractions, leaking of fluid and fetal movement were reviewed in detail with the patient. Please refer to After Visit Summary for other counseling recommendations.   - GBS bacteruria. Amoxicillin  Return in about 2 weeks (around 11/15/2018) for U/S for viability/SCH follow up. Routine prenatal after.  Thomasene MohairStephen Nneka Blanda, MD, [facility] FrederickFACOG [facility], [facility] 11/01/2018 2:51 PM

## 2018-11-15 ENCOUNTER — Encounter: Payer: Self-pay | Admitting: Maternal Newborn

## 2018-11-15 ENCOUNTER — Ambulatory Visit (INDEPENDENT_AMBULATORY_CARE_PROVIDER_SITE_OTHER): Payer: Medicaid Other | Admitting: Maternal Newborn

## 2018-11-15 ENCOUNTER — Ambulatory Visit (INDEPENDENT_AMBULATORY_CARE_PROVIDER_SITE_OTHER): Payer: Medicaid Other

## 2018-11-15 VITALS — BP 102/70 | Wt 169.0 lb

## 2018-11-15 DIAGNOSIS — O0990 Supervision of high risk pregnancy, unspecified, unspecified trimester: Secondary | ICD-10-CM

## 2018-11-15 DIAGNOSIS — Z3A09 9 weeks gestation of pregnancy: Secondary | ICD-10-CM

## 2018-11-15 DIAGNOSIS — N8311 Corpus luteum cyst of right ovary: Secondary | ICD-10-CM

## 2018-11-15 DIAGNOSIS — O3481 Maternal care for other abnormalities of pelvic organs, first trimester: Secondary | ICD-10-CM

## 2018-11-15 DIAGNOSIS — O34211 Maternal care for low transverse scar from previous cesarean delivery: Secondary | ICD-10-CM

## 2018-11-15 LAB — POCT URINALYSIS DIPSTICK OB
GLUCOSE, UA: NEGATIVE
POC,PROTEIN,UA: NEGATIVE

## 2018-11-15 NOTE — Progress Notes (Signed)
    Routine Prenatal Care Visit  Subjective  Lori Velasquez is a 27 y.o. G3P2002 at 313w1d being seen today for ongoing prenatal care.  She is currently monitored for the following issues for this high-risk pregnancy and has Iron deficiency anemia; Supervision of high risk pregnancy, antepartum; Short interval between pregnancies affecting pregnancy, antepartum; History of cesarean delivery, antepartum; and Group B streptococcal bacteriuria on their problem list.  ----------------------------------------------------------------------------------- Patient reports no complaints.   Contractions: Not present. Vag. Bleeding: None.  ----------------------------------------------------------------------------------- The following portions of the patient's history were reviewed and updated as appropriate: allergies, current medications, past family history, past medical history, past social history, past surgical history and problem list. Problem list updated.  Objective  Blood pressure 102/70, weight 169 lb (76.7 kg), last menstrual period 08/28/2018. Pregravid weight 167 lb (75.8 kg) Total Weight Gain 2 lb (0.907 kg) Body mass index is 34.13 kg/m.   Urinalysis: Protein Negative, Glucose Negative  Fetal Status: Fetal Heart Rate (bpm): 165         General:  Alert, oriented and cooperative. Patient is in no acute distress.  Skin: Skin is warm and dry. No rash noted.   Cardiovascular: Normal heart rate noted  Respiratory: Normal respiratory effort, no problems with respiration noted  Abdomen: Soft, gravid, appropriate for gestational age. Pain/Pressure: Absent     Pelvic:  Cervical exam deferred        Extremities: Normal range of motion.     Mental Status: Normal mood and affect. Normal behavior. Normal judgment and thought content.     Assessment   27 y.o. [id number] at 6213w1d, EDD 06/19/2019 by Ultrasound presenting for a routine prenatal visit.  Plan   Pregnancy #3 Problems (from 08/28/18 to  present)    Problem Noted Resolved   Group B streptococcal bacteriuria 11/01/2018 by Conard NovakJackson, Stephen D, MD No   Supervision of high risk pregnancy, antepartum 10/25/2018 by Natale MilchSchuman, Christanna R, MD No   Overview Addendum 10/25/2018  2:36 PM by Natale MilchSchuman, Christanna R, MD      Clinic [facility] Prenatal Labs  Dating  Blood type: A POS  Genetic Screen  Declines Antibody:NEG (04/24 1022)  Anatomic US  Rubella:   Varicella: @VZVIGG @  GTT Early:        28 wk:      RPR: Non Reactive (01/31 1539)   Rhogam  not needed HBsAg:     TDaP vaccine                       HIV: Non Reactive (01/31 1539)   Flu Shot   10/25/18                             GBS:   Contraception  tubal ligation  [ ]  sign BTL consents Pap: 2019 NIL  CBB     CS/VBAC  desires RLTCS   Baby Food     Support Person   FOB: Leotis ShamesJeffery Mother: Dorita FrayKimberly            Short interval between pregnancies affecting pregnancy, antepartum 10/25/2018 by Natale MilchSchuman, Christanna R, MD No   History of cesarean delivery, antepartum 10/25/2018 by Natale MilchSchuman, Christanna R, MD No    Ultrasound today shows IUP, size consistent with dates, FHR 165 and prior subchorionic hemorrhage has resolved.   Return in about 4 weeks (around 12/13/2018) for ROB.  Marcelyn BruinsJacelyn Tomasina Keasling, CNM 11/15/2018  2:17 PM

## 2018-12-10 ENCOUNTER — Other Ambulatory Visit: Payer: Self-pay | Admitting: Obstetrics and Gynecology

## 2018-12-10 ENCOUNTER — Telehealth: Payer: Self-pay

## 2018-12-10 DIAGNOSIS — O219 Vomiting of pregnancy, unspecified: Secondary | ICD-10-CM

## 2018-12-10 MED ORDER — DOXYLAMINE-PYRIDOXINE 10-10 MG PO TBEC: 2.0000 | DELAYED_RELEASE_TABLET | Freq: Every day | ORAL | 5 refills | Status: DC

## 2018-12-10 NOTE — Telephone Encounter (Signed)
Prescription for diclegis sent to pharmacy

## 2018-12-10 NOTE — Telephone Encounter (Signed)
Pt calling requesting nausea medication. Please advise. CS saw her for initial ROB appt

## 2018-12-10 NOTE — Telephone Encounter (Signed)
Left vm with pt that rx was sent and told her to call if there are any issue with obtaining the rx

## 2018-12-10 NOTE — Telephone Encounter (Signed)
Please advise. Pt has medicaid as primary

## 2018-12-13 ENCOUNTER — Ambulatory Visit (INDEPENDENT_AMBULATORY_CARE_PROVIDER_SITE_OTHER): Payer: Medicaid Other | Admitting: Obstetrics and Gynecology

## 2018-12-13 ENCOUNTER — Encounter: Payer: Self-pay | Admitting: Obstetrics and Gynecology

## 2018-12-13 VITALS — BP 112/60 | Wt 166.0 lb

## 2018-12-13 DIAGNOSIS — Z3A13 13 weeks gestation of pregnancy: Secondary | ICD-10-CM

## 2018-12-13 DIAGNOSIS — O34219 Maternal care for unspecified type scar from previous cesarean delivery: Secondary | ICD-10-CM

## 2018-12-13 DIAGNOSIS — O0990 Supervision of high risk pregnancy, unspecified, unspecified trimester: Secondary | ICD-10-CM

## 2018-12-13 DIAGNOSIS — O09899 Supervision of other high risk pregnancies, unspecified trimester: Secondary | ICD-10-CM

## 2018-12-13 DIAGNOSIS — D508 Other iron deficiency anemias: Secondary | ICD-10-CM

## 2018-12-13 NOTE — Progress Notes (Signed)
Routine Prenatal Care Visit  Subjective  Lori Velasquez is a 28 y.o. G3P2002 at [redacted]w[redacted]d being seen today for ongoing prenatal care.  She is currently monitored for the following issues for this high-risk pregnancy and has Iron deficiency anemia; Supervision of high risk pregnancy, antepartum; Short interval between pregnancies affecting pregnancy, antepartum; History of cesarean delivery, antepartum; and Group B streptococcal bacteriuria on their problem list.  ----------------------------------------------------------------------------------- Patient reports coldy symptoms of fever and bodyaches.   Contractions: Not present. Vag. Bleeding: None.   . Denies leaking of fluid.  ----------------------------------------------------------------------------------- The following portions of the patient's history were reviewed and updated as appropriate: allergies, current medications, past family history, past medical history, past social history, past surgical history and problem list. Problem list updated.   Objective  Blood pressure 112/60, weight 166 lb (75.3 kg), last menstrual period 08/28/2018, not currently breastfeeding. Pregravid weight 167 lb (75.8 kg) Total Weight Gain -1 lb (-0.454 kg) Urinalysis:      Fetal Status: Fetal Heart Rate (bpm): 160         General:  Alert, oriented and cooperative. Patient is in no acute distress.  Skin: Skin is warm and dry. No rash noted.   Cardiovascular: Normal heart rate noted  Respiratory: Normal respiratory effort, no problems with respiration noted  Abdomen: Soft, gravid, appropriate for gestational age. Pain/Pressure: Absent     Pelvic:  Cervical exam deferred        Extremities: Normal range of motion.     Mental Status: Normal mood and affect. Normal behavior. Normal judgment and thought content.     Assessment   28 y.o. G3P2002 at [redacted]w[redacted]d by  06/19/2019, by Ultrasound presenting for routine prenatal visit  Plan   Pregnancy #3 Problems  (from 08/28/18 to present)    Problem Noted Resolved   Group B streptococcal bacteriuria 11/01/2018 by Conard Novak, MD No   Supervision of high risk pregnancy, antepartum 10/25/2018 by Natale Milch, MD No   Overview Addendum 12/13/2018  4:25 PM by Natale Milch, MD      Clinic [facility] Prenatal Labs  Dating  7 wk [place] Blood type: A POS  Genetic Screen  Declines Antibody:Negative (11/18 1455)  Anatomic [place]  Rubella: 6.30 (11/18 1455) IMMUNE Varicella: Immune  GTT Early:        28 wk:      RPR: Non Reactive (11/18 1455)   Rhogam  not needed HBsAg: Negative (11/18 1455)   TDaP vaccine                       HIV: Non Reactive (11/18 1455)   Flu Shot   10/25/18                             GBS:   Contraception  tubal ligation  [ ]  sign BTL consents Pap: 2019 NIL  CBB     CS/VBAC  desires RLTCS   Baby Food     Support Person   FOB: Leotis Shames Mother: Dorita Fray interval between pregnancies affecting pregnancy, antepartum 10/25/2018 by Natale Milch, MD No   History of cesarean delivery, antepartum 10/25/2018 by Natale Milch, MD No       Gestational age appropriate obstetric precautions including but not limited to vaginal bleeding, contractions, leaking of fluid and fetal movement were reviewed in detail with  the patient.    Discussed tylenol, mucinex, robitussin for cold symptoms Encouraged her to follow up with urgent care for a flu swab today.  Return in about 4 weeks (around 01/10/2019) for ROB.  Natale Milch MD [facility], [facility] 12/13/2018, 4:27 PM

## 2018-12-13 NOTE — Progress Notes (Signed)
ROB C/o cold symptoms

## 2019-01-10 ENCOUNTER — Ambulatory Visit (INDEPENDENT_AMBULATORY_CARE_PROVIDER_SITE_OTHER): Payer: Medicaid Other | Admitting: Certified Nurse Midwife

## 2019-01-10 VITALS — BP 110/66 | Wt 174.0 lb

## 2019-01-10 DIAGNOSIS — Z363 Encounter for antenatal screening for malformations: Secondary | ICD-10-CM

## 2019-01-10 DIAGNOSIS — Z3A17 17 weeks gestation of pregnancy: Secondary | ICD-10-CM

## 2019-01-10 DIAGNOSIS — O0990 Supervision of high risk pregnancy, unspecified, unspecified trimester: Secondary | ICD-10-CM

## 2019-01-10 LAB — POCT URINALYSIS DIPSTICK OB
Glucose, UA: NEGATIVE
POC,PROTEIN,UA: NEGATIVE

## 2019-01-10 NOTE — Progress Notes (Signed)
No complaints

## 2019-01-10 NOTE — Progress Notes (Signed)
ROB at 17wk1 day: Feeling flutters. No vaginal bleeding. No complaints Declines MSAFP testing FH at U-2FB/ FHTs WNL Anatomy scan and ROB in 3 weeks  Farrel Conners, CNM

## 2019-01-31 ENCOUNTER — Encounter: Payer: Self-pay | Admitting: Advanced Practice Midwife

## 2019-01-31 ENCOUNTER — Ambulatory Visit (INDEPENDENT_AMBULATORY_CARE_PROVIDER_SITE_OTHER): Payer: Medicaid Other

## 2019-01-31 ENCOUNTER — Ambulatory Visit (INDEPENDENT_AMBULATORY_CARE_PROVIDER_SITE_OTHER): Payer: Medicaid Other | Admitting: Advanced Practice Midwife

## 2019-01-31 VITALS — BP 132/64 | Wt 173.0 lb

## 2019-01-31 DIAGNOSIS — O0990 Supervision of high risk pregnancy, unspecified, unspecified trimester: Secondary | ICD-10-CM

## 2019-01-31 DIAGNOSIS — Z3A20 20 weeks gestation of pregnancy: Secondary | ICD-10-CM

## 2019-01-31 DIAGNOSIS — Z363 Encounter for antenatal screening for malformations: Secondary | ICD-10-CM

## 2019-01-31 DIAGNOSIS — O34219 Maternal care for unspecified type scar from previous cesarean delivery: Secondary | ICD-10-CM

## 2019-01-31 NOTE — Progress Notes (Signed)
Routine Prenatal Care Visit  Subjective  GULIANA Velasquez is a 28 y.o. G3P2002 at [redacted]w[redacted]d being seen today for ongoing prenatal care.  She is currently monitored for the following issues for this high-risk pregnancy and has Iron deficiency anemia; Supervision of high risk pregnancy, antepartum; Short interval between pregnancies affecting pregnancy, antepartum; History of cesarean delivery, antepartum; and Group B streptococcal bacteriuria on their problem list.  ----------------------------------------------------------------------------------- Patient reports no complaints.   Contractions: Not present. Vag. Bleeding: None.  Movement: Present. Denies leaking of fluid.  ----------------------------------------------------------------------------------- The following portions of the patient's history were reviewed and updated as appropriate: allergies, current medications, past family history, past medical history, past social history, past surgical history and problem list. Problem list updated.   Objective  Blood pressure 132/64, weight 173 lb (78.5 kg), last menstrual period 08/28/2018 Pregravid weight 167 lb (75.8 kg) Total Weight Gain 6 lb (2.722 kg) Urinalysis: Urine Protein    Urine Glucose    Fetal Status: Fetal Heart Rate (bpm): 160   Movement: Present     ULTRASOUND REPORT  Location: [facility] Date of Service: 01/31/2019   Patient Name: Lori Velasquez DOB: 1991-02-21 MRN: [id number]  Indications:Anatomy Ultrasound Findings:  Lori Velasquez intrauterine pregnancy is visualized with FHR at 160 BPM. Biometrics give an (U/S) Gestational age of [redacted]w[redacted]d and an (U/S) EDD of 06/19/2019; this correlates with the clinically established Estimated Date of Delivery: 06/19/19  Fetal presentation is Cephalic.  EFW: 351 gram (12 oz). Placenta: anterior placenta previa . Grade: 0 AFI: subjectively normal.  Anatomic survey is complete and normal; Gender - female.    Right Ovary is normal in  appearance. Left Ovary is normal appearance. Survey of the adnexa demonstrates no adnexal masses. There is no free peritoneal fluid in the cul de sac.  Impression: 1. [redacted]w[redacted]d Viable Singleton Intrauterine pregnancy by U/S. 2. (U/S) EDD is consistent with Clinically established Estimated Date of Delivery: 06/19/19 . 3. Normal Anatomy Scan. 4. Marginal placental cord insertion possibly. 5. Complete placenta previa is seen.  Recommendations: 1.Clinical correlation with the patient's History and Physical Exam.   Mital bahen Leodis Binet, RDMS  General:  Alert, oriented and cooperative. Patient is in no acute distress.  Skin: Skin is warm and dry. No rash noted.   Cardiovascular: Normal heart rate noted  Respiratory: Normal respiratory effort, no problems with respiration noted  Abdomen: Soft, gravid, appropriate for gestational age. Pain/Pressure: Absent     Pelvic:  Cervical exam deferred        Extremities: Normal range of motion.     Mental Status: Normal mood and affect. Normal behavior. Normal judgment and thought content.   Assessment   28 y.o. G3P2002 at [redacted]w[redacted]d by  06/19/2019, by Ultrasound presenting for routine prenatal visit  Plan   Pregnancy #3 Problems (from 08/28/18 to present)    Problem Noted Resolved   Group B streptococcal bacteriuria 11/01/2018 by Conard Novak, MD No   Supervision of high risk pregnancy, antepartum 10/25/2018 by Natale Milch, MD No   Overview Addendum 12/13/2018  4:25 PM by Natale Milch, MD      Clinic [facility] Prenatal Labs  Dating  7 wk [place] Blood type: A POS  Genetic Screen  Declines Antibody:Negative (11/18 1455)  Anatomic [place]  Rubella: 6.30 (11/18 1455) IMMUNE Varicella: Immune  GTT Early:        28 wk:      RPR: Non Reactive (11/18 1455)   Rhogam  not needed HBsAg: Negative (11/18  1455)   TDaP vaccine                       HIV: Non Reactive (11/18 1455)   Flu Shot   10/25/18                             GBS:     Contraception  tubal ligation  [ ]  sign BTL consents Pap: 2019 NIL  CBB     CS/VBAC  desires RLTCS   Baby Food     Support Person   FOB: Leotis Shames Mother: Dorita Fray interval between pregnancies affecting pregnancy, antepartum 10/25/2018 by Natale Milch, MD No   History of cesarean delivery, antepartum 10/25/2018 by Natale Milch, MD No       Preterm labor symptoms and general obstetric precautions including but not limited to vaginal bleeding, contractions, leaking of fluid and fetal movement were reviewed in detail with the patient.  Pelvic rest Recheck placentation at 28 weeks Please refer to After Visit Summary for other counseling recommendations.   Return in about 4 weeks (around 02/28/2019) for rob.  Tresea Mall, CNM 01/31/2019 3:32 PM

## 2019-01-31 NOTE — Progress Notes (Signed)
ROB/Anatomy No concerns  Its a GIRL!

## 2019-01-31 NOTE — Patient Instructions (Signed)
Placenta Previa Placenta previa is a condition in which the placenta implants in the lower part of the uterus in pregnant women. The placenta either partially or completely covers the opening to the cervix. This is a problem because the baby must pass through the cervix during delivery. There are three types of placenta previa:  Marginal placenta previa. The placenta reaches within an inch (2.5 cm) of the cervical opening but does not cover it.  Partial placenta previa. The placenta covers part of the cervical opening.  Complete placenta previa. The placenta covers the entire cervical opening. If the previa is marginal or partial and it is diagnosed in the first half of pregnancy, the placenta may move into a normal position as the pregnancy progresses and may no longer cover the cervix. It is important to keep all prenatal visits with your health care provider so you can be more closely monitored. What are the causes? The cause of this condition is not known. What increases the risk? This condition is more likely to develop in women who:  Are carrying more than one baby (multiples).  Have an abnormally shaped uterus.  Have scars on the lining of the uterus.  Have had surgeries involving the uterus, such as a cesarean delivery.  Have delivered a baby before.  Have a history of placenta previa.  Have smoked or used cocaine during pregnancy.  Are age 35 or older during pregnancy. What are the signs or symptoms? The main symptom of this condition is sudden, painless vaginal bleeding during the second half of pregnancy. The amount of bleeding can be very light at first, and it usually stops on its own. Heavier bleeding episodes may also happen. Some women with placenta previa may have no bleeding at all. How is this diagnosed?  This condition is diagnosed: ? From an ultrasound. This test uses sound waves to find where the placenta is located before you have any bleeding  episodes. ? During a checkup after vaginal bleeding is noticed.  If you are diagnosed with a partial or complete previa, digital exams with fingers will generally be avoided. Your health care provider will still perform a speculum exam.  If you did not have an ultrasound during your pregnancy, placenta previa may not be diagnosed until bleeding occurs during labor. How is this treated? Treatment for this condition may include:  Decreased activity.  Bed rest at home or in the hospital.  Pelvic rest. Nothing is placed inside the vagina during pelvic rest. This means not having sex and not using tampons or douches.  A blood transfusion to replace blood that you have lost (maternal blood loss).  A cesarean delivery. This may be performed if: ? The bleeding is heavy and cannot be controlled. ? The placenta completely covers the cervix.  Medicines to stop premature labor or to help the baby's lungs to mature. This treatment may be used if you need delivery before your pregnancy is full-term. Your treatment will be decided based on:  How much you are bleeding, or whether the bleeding has stopped.  How far along you are in your pregnancy.  The condition of your baby.  The type of placenta previa that you have.  Follow these instructions at home:  Get plenty of rest and lessen activity as told by your health care provider.  Stay on bed rest for as long as told by your health care provider.  Do not have sex, use tampons, use a douche, or place anything inside of   plenty of rest and lessen activity as told by your health care provider.   Stay on bed rest for as long as told by your health care provider.   Do not have sex, use tampons, use a douche, or place anything inside of your vagina if your health care provider recommended pelvic rest.   Take over-the-counter and prescription medicines as told by your health care provider.   Keep all follow-up visits as told by your health care provider. This is important.  Get help right away if:   You have vaginal bleeding, even if in small amounts and even if you have no pain.   You have cramping or regular contractions.   You have pain in your abdomen or  your lower back.   You have a feeling of increased pressure in your pelvis.   You have increased watery or bloody mucus from the vagina.  This information is not intended to replace advice given to you by your health care provider. Make sure you discuss any questions you have with your health care provider.  Document Released: 11/24/2005 Document Revised: 08/13/2016 Document Reviewed: 06/07/2016  Elsevier Interactive Patient Education  2019 [employer]

## 2019-02-03 ENCOUNTER — Encounter: Payer: Self-pay | Admitting: Certified Nurse Midwife

## 2019-02-03 DIAGNOSIS — O4400 Placenta previa specified as without hemorrhage, unspecified trimester: Secondary | ICD-10-CM | POA: Insufficient documentation

## 2019-02-28 ENCOUNTER — Encounter: Payer: Medicaid Other | Admitting: Maternal Newborn

## 2019-03-10 ENCOUNTER — Other Ambulatory Visit: Payer: Self-pay | Admitting: Certified Nurse Midwife

## 2019-03-10 DIAGNOSIS — O0992 Supervision of high risk pregnancy, unspecified, second trimester: Secondary | ICD-10-CM

## 2019-03-17 ENCOUNTER — Encounter: Payer: Medicaid Other | Admitting: Obstetrics and Gynecology

## 2019-03-17 ENCOUNTER — Ambulatory Visit (INDEPENDENT_AMBULATORY_CARE_PROVIDER_SITE_OTHER): Payer: Medicaid Other | Admitting: Certified Nurse Midwife

## 2019-03-17 ENCOUNTER — Ambulatory Visit: Payer: Medicaid Other

## 2019-03-17 ENCOUNTER — Other Ambulatory Visit: Payer: Self-pay

## 2019-03-17 ENCOUNTER — Ambulatory Visit (INDEPENDENT_AMBULATORY_CARE_PROVIDER_SITE_OTHER): Payer: Medicaid Other

## 2019-03-17 VITALS — BP 118/62 | Wt 185.0 lb

## 2019-03-17 DIAGNOSIS — O0992 Supervision of high risk pregnancy, unspecified, second trimester: Secondary | ICD-10-CM

## 2019-03-17 DIAGNOSIS — Z3A26 26 weeks gestation of pregnancy: Secondary | ICD-10-CM

## 2019-03-17 DIAGNOSIS — O0990 Supervision of high risk pregnancy, unspecified, unspecified trimester: Secondary | ICD-10-CM

## 2019-03-17 DIAGNOSIS — Z362 Encounter for other antenatal screening follow-up: Secondary | ICD-10-CM | POA: Diagnosis not present

## 2019-03-17 DIAGNOSIS — Z113 Encounter for screening for infections with a predominantly sexual mode of transmission: Secondary | ICD-10-CM

## 2019-03-17 DIAGNOSIS — O4442 Low lying placenta NOS or without hemorrhage, second trimester: Secondary | ICD-10-CM

## 2019-03-17 DIAGNOSIS — Z131 Encounter for screening for diabetes mellitus: Secondary | ICD-10-CM

## 2019-03-17 LAB — POCT URINALYSIS DIPSTICK OB
Glucose, UA: NEGATIVE
POC,PROTEIN,UA: NEGATIVE

## 2019-03-17 MED ORDER — PRENATAL 27-0.8 MG PO TABS: 1.0000 | ORAL_TABLET | Freq: Every day | ORAL | 11 refills | Status: DC

## 2019-03-17 NOTE — Progress Notes (Signed)
U/S today f/u previa. No complaints

## 2019-03-17 NOTE — Progress Notes (Signed)
HROB/ [place] for placental location at 26wk4d: No vaginal bleeding. Good FM. PLacenta is no longer previa. Now low lying about 1.95 cm from os. No longer needs to be on pelvic rest. Confusion today regarding lab work. 1 hour GTT not done. Had a CBC done only Desires BTL with CS: Discussed permanence of procedure as well as failure rate of 1/300 and risk of ectopic if she does become pregnant. Also ware of risks of bleeding. Will sign 30 day papers at next visit in 2 weeks. Will get 1 hr GTT, RPR and HIV at that time. RX for prenatal vitamins sent to pharmacy.  Farrel Conners, CNM

## 2019-03-22 ENCOUNTER — Other Ambulatory Visit: Payer: Self-pay | Admitting: Certified Nurse Midwife

## 2019-03-31 ENCOUNTER — Ambulatory Visit (INDEPENDENT_AMBULATORY_CARE_PROVIDER_SITE_OTHER): Payer: Medicaid Other | Admitting: Obstetrics and Gynecology

## 2019-03-31 ENCOUNTER — Encounter: Payer: Self-pay | Admitting: Obstetrics and Gynecology

## 2019-03-31 ENCOUNTER — Other Ambulatory Visit: Payer: Self-pay

## 2019-03-31 ENCOUNTER — Other Ambulatory Visit: Payer: Medicaid Other

## 2019-03-31 VITALS — BP 130/60 | Wt 189.0 lb

## 2019-03-31 DIAGNOSIS — O34219 Maternal care for unspecified type scar from previous cesarean delivery: Secondary | ICD-10-CM

## 2019-03-31 DIAGNOSIS — O4443 Low lying placenta NOS or without hemorrhage, third trimester: Secondary | ICD-10-CM

## 2019-03-31 DIAGNOSIS — Z3A28 28 weeks gestation of pregnancy: Secondary | ICD-10-CM

## 2019-03-31 DIAGNOSIS — O0990 Supervision of high risk pregnancy, unspecified, unspecified trimester: Secondary | ICD-10-CM

## 2019-03-31 DIAGNOSIS — O09899 Supervision of other high risk pregnancies, unspecified trimester: Secondary | ICD-10-CM

## 2019-03-31 DIAGNOSIS — Z131 Encounter for screening for diabetes mellitus: Secondary | ICD-10-CM

## 2019-03-31 DIAGNOSIS — Z113 Encounter for screening for infections with a predominantly sexual mode of transmission: Secondary | ICD-10-CM

## 2019-03-31 DIAGNOSIS — O4440 Low lying placenta NOS or without hemorrhage, unspecified trimester: Secondary | ICD-10-CM

## 2019-03-31 LAB — POCT URINALYSIS DIPSTICK OB
Glucose, UA: NEGATIVE
POC,PROTEIN,UA: NEGATIVE

## 2019-03-31 NOTE — Progress Notes (Signed)
Routine Prenatal Care Visit  Subjective  Lori Velasquez is a 28 y.o. G3P2002 at 4031w4d being seen today for ongoing prenatal care.  She is currently monitored for the following issues for this high-risk pregnancy and has Iron deficiency anemia; Supervision of high risk pregnancy, antepartum; Short interval between pregnancies affecting pregnancy, antepartum; History of cesarean delivery, antepartum; Group B streptococcal bacteriuria; and Placenta previa antepartum on their problem list.  ----------------------------------------------------------------------------------- Patient reports no complaints.   Contractions: Not present. Vag. Bleeding: None.  Movement: Present. Denies leaking of fluid.  ----------------------------------------------------------------------------------- The following portions of the patient's history were reviewed and updated as appropriate: allergies, current medications, past family history, past medical history, past social history, past surgical history and problem list. Problem list updated.   Objective  Blood pressure 130/60, weight 189 lb (85.7 kg), last menstrual period 08/28/2018, not currently breastfeeding. Pregravid weight 167 lb (75.8 kg) Total Weight Gain 22 lb (9.979 kg) Urinalysis:      Fetal Status: Fetal Heart Rate (bpm): 140 Fundal Height: 30 cm Movement: Present     General:  Alert, oriented and cooperative. Patient is in no acute distress.  Skin: Skin is warm and dry. No rash noted.   Cardiovascular: Normal heart rate noted  Respiratory: Normal respiratory effort, no problems with respiration noted  Abdomen: Soft, gravid, appropriate for gestational age. Pain/Pressure: Absent     Pelvic:  Cervical exam deferred        Extremities: Normal range of motion.     Mental Status: Normal mood and affect. Normal behavior. Normal judgment and thought content.     Assessment   28 y.o. G3P2002 at 7831w4d by  06/19/2019, by Ultrasound presenting for  routine prenatal visit  Plan   Pregnancy #3 Problems (from 08/28/18 to present)    Problem Noted Resolved   Group B streptococcal bacteriuria 11/01/2018 by Conard NovakJackson, Stephen D, MD No   Supervision of high risk pregnancy, antepartum 10/25/2018 by Natale MilchSchuman, Lusine Corlett R, MD No   Overview Addendum 12/13/2018  4:25 PM by Natale MilchSchuman, Yvette Loveless R, MD      Clinic [facility] Prenatal Labs  Dating  7 wk US Blood type: A POS  Genetic Screen  Declines Antibody:Negative (11/18 1455)  Anatomic US complete Rubella: 6.30 (11/18 1455) IMMUNE Varicella: Immune  GTT Early:        28 wk:      RPR: Non Reactive (11/18 1455)   Rhogam  not needed HBsAg: Negative (11/18 1455)   TDaP vaccine                       HIV: Non Reactive (11/18 1455)   Flu Shot   10/25/18                             GBS:   Contraception  tubal ligation  [ ]  sign BTL consents Pap: 2019 NIL  CBB     CS/VBAC  desires RLTCS   Baby Food     Support Person   FOB: Leotis ShamesJeffery Mother: Lori FrayKimberly            Short interval between pregnancies affecting pregnancy, antepartum 10/25/2018 by Natale MilchSchuman, Lesslie Mckeehan R, MD No   History of cesarean delivery, antepartum 10/25/2018 by Natale MilchSchuman, Makhi Muzquiz R, MD No       Gestational age appropriate obstetric precautions including but not limited to vaginal bleeding, contractions, leaking of fluid and fetal movement were reviewed in detail  with the patient.    Patient scheduled for MRI for possible placenta accreta evaluation. History of multiple cesareans with short pregnancy intervals.  Patient signed consents for tubal ligation today.  28 week labs today  Consider growth [place] at 32 weeks.   Return in about 2 weeks (around 04/14/2019) for ROB telephone visit.  Natale Milch MD [facility], [facility] 03/31/2019, 5:10 PM

## 2019-03-31 NOTE — Progress Notes (Signed)
ROB/GTT- no concerns 

## 2019-03-31 NOTE — Patient Instructions (Signed)
Hello,  Given the current COVID-19 pandemic, our practice is making changes in how we are providing care to our patients. We are limiting in-person visits for the safety of all of our patients.   As a practice, we have met to discuss the best way to minimize visits, but still provide excellent care to our expecting mothers.  We have decided on the following visit structure for low-risk pregnancies.  Initial Pregnancy visit will be conducted as a telephone or web visit.  Between 10-14 weeks  there will be one in-person visit for an ultrasound, lab work, and genetic screening. 20 weeks in-person visit with an anatomy ultrasound  28 weeks in-person office visit for a 1-hour glucose test and a TDAP vaccination 32 weeks in-person office visit 34 weeks telephone visit 36 weeks in-person office visit for GBS, chlamydia, and gonorrhea testing 38 weeks in-person office visit 40 weeks in-person office visit  Understandably, some patients will require more visits than what is outlined above. Additional visits will be determined on a case-by-case basis.   We will, as always, be available for emergencies or to address concerns that might arise between in-person visits. We ask that you allow [place] the opportunity to address any concerns over the phone or through a virtual visit first. We will be available to return your phone calls throughout the day.   If you are able to purchase a scale, a blood pressure machine, and a home fetal doppler visits could be limited further. This will help decrease your exposure risks, but these purchases are not a necessity.   Things seem to change daily and there is the possibility that this structure could change, please be patient as we adapt to a new way of caring for patients.   Thank you for trusting [place] with your prenatal care. Our practice values you and looks forward to providing you with excellent care.   Sincerely,   [facility], [place]     COVID-19 and Your Pregnancy FAQ  How can I prevent infection with COVID-19 during my pregnancy? Social distancing is key. Please limit any interactions in public. Try and work from home if possible. Frequently wash your hands after touching possibly contaminated surfaces. Avoid touching your face.  Minimize trips to the store. Consider online ordering when possible.   Should I wear a mask? YES. It is recommended by the CDC that all people wear a cloth mask or facial covering in public. This will help reduce transmission as well as your risk or acquiring COVID-19. New studies are showing that even asymptomatic individuals can spread the virus from talking.   What are the symptoms of COVID-19? Fever (greater than 100.4 F), dry cough, shortness of breath.  Am I more at risk for COVID-19 since I am pregnant? There is not currently data showing that pregnant women are more adversely impacted by COVID-19 than the general population. However, we know that pregnant women tend to have worse respiratory complications from similar diseases such as the flu and SARS and for this reason should be considered an at-risk population.  What do I do if I am experiencing the symptoms of COVID-19? Testing is being limited because of test availability. If you are experiencing symptoms you should quarantine yourself, and the members of your family, for at least 2 weeks at home.   Please visit this website for more information: [email or web address]  When should I go to the Emergency Room? Please go to the emergency room if you  are experiencing ANY of these symptoms*:  1.    Difficulty breathing or shortness of breath 2.    Persistent pain or pressure in the chest 3.    Confusion or difficulty being aroused (or awakened) 4.    Bluish lips or face  *This list is not all inclusive. Please consult our office for any other symptoms that are severe or  concerning.  What do I do if I am having difficulty breathing? You should go to the Emergency Room for evaluation. At this time they have a tent set up for evaluating patients with COVID-19 symptoms.   How will my prenatal care be different because of the COVID-19 pandemic? It has been recommended to reduce the frequency of face-to-face visits and use resources such as telephone and virtual visits when possible. Using a scale, blood pressure machine and fetal doppler at home can further help reduce face-to-face visits. You will be provided with additional information on this topic.  We ask that you come to your visits alone to minimize potential exposures to  COVID-19.  How can I receive childbirth education? At this time in-person classes have been cancelled. You can register for online childbirth education, breastfeeding, and newborn care classes.  Please visit:  [email or web address] for more information  How will my hospital birth experience be different? The hospital is currently limiting visitors. This means that while you are in labor you can only have one person at the hospital with you. Additional family members will not be allowed to wait in the building or outside your room. Your one support person can be the father of the baby, a relative, a doula, or a friend. Once one support person is designated that person will wear a band. This band cannot be shared with multiple people.  Nitrous Gas is not being offered for pain relief since the tubing and filter for the machine can not be sanitized in a way to guarantee prevention of transmission of COVID-19.  Nasal cannula use of oxygen for fetal indications has also been discontinued.  Currently a clear plastic sheet is being hung between mom and the delivering provider during pushing and delivery to help prevent transmission of COVID-19.      How long will I stay in the hospital for after giving birth? It is also recommended that  discharge home be expedited during the COVID-19 outbreak. This means staying for 1 day after a vaginal delivery and 2 days after a cesarean section. Patients who need to stay longer for medical reasons are allowed to do so, but the goal will be for expedited discharge home.   What if I have COVID-19 and I am in labor? We ask that you wear a mask while on labor and delivery. We will try and accommodate you being placed in a room that is capable of filtering the air. Please call ahead if you are in labor and on your way to the hospital. The phone number for labor and delivery at [facility] is [phone].  If I have COVID-19 when my baby is born how can I prevent my baby from contracting COVID-19? This is an issue that will have to be discussed on a case-by-case basis. Current recommendations suggest providing separate isolation rooms for both the mother and new infant as well as limiting visitors. However, there are practical challenges to this recommendation. The situation will assuredly change and decisions will be influenced by the desires of the mother and availability of space.    Some suggestions are the use of a curtain or physical barrier between mom and infant, hand hygiene, mom wearing a mask, or 6 feet of spacing between a mom and infant.   Can I breastfeed during the COVID-19 pandemic?   Yes, breastfeeding is encouraged.  Can I breastfeed if I have COVID-19? Yes. Covid-19 has not been found in breast milk. This means you cannot give COVID-19 to your child through breast milk. Breast feeding will also help pass antibodies to fight infection to your baby.   What precautions should I take when breastfeeding if I have COVID-19? If a mother and newborn do room-in and the mother wishes to feed at the breast, she should put on a facemask and practice hand hygiene before each feeding.  What precautions should I take when pumping if I have COVID-19? Prior to expressing  breast milk, mothers should practice hand hygiene. After each pumping session, all parts that come into contact with breast milk should be thoroughly washed and the entire pump should be appropriately disinfected per the manufacturer's instructions. This expressed breast milk should be fed to the newborn by a healthy caregiver.  What if I am pregnant and work in healthcare? Based on limited data regarding COVID-19 and pregnancy, ACOG currently does not propose creating additional restrictions on pregnant health care personnel because of COVID-19 alone. Pregnant women do not appear to be at higher risk of severe disease related to COVID-19. Pregnant health care personnel should follow CDC risk assessment and infection control guidelines for health care personnel exposed to patients with suspected or confirmed COVID-19. Adherence to recommended infection prevention and control practices is an important part of protecting all health care personnel in health care settings.    Information on COVID-19 in pregnancy is very limited; however, facilities may want to consider limiting exposure of pregnant health care personnel to patients with confirmed or suspected COVID-19 infection, especially during higher-risk procedures (eg, aerosol-generating procedures), if feasible, based on staffing availability.

## 2019-04-01 LAB — GLUCOSE, 1 HOUR GESTATIONAL: Gestational Diabetes Screen: 131 mg/dL (ref 65–139)

## 2019-04-01 LAB — RPR QUALITATIVE: RPR Ser Ql: NONREACTIVE

## 2019-04-01 LAB — HIV ANTIBODY (ROUTINE TESTING W REFLEX): HIV Screen 4th Generation wRfx: NONREACTIVE

## 2019-04-07 ENCOUNTER — Other Ambulatory Visit: Payer: Self-pay

## 2019-04-07 ENCOUNTER — Ambulatory Visit
Admission: RE | Admit: 2019-04-07 | Discharge: 2019-04-07 | Disposition: A | Discharge status: Home / Self Care | Payer: Medicaid Other | Source: Ambulatory Visit | Attending: Obstetrics and Gynecology | Admitting: Obstetrics and Gynecology

## 2019-04-07 DIAGNOSIS — O0993 Supervision of high risk pregnancy, unspecified, third trimester: Secondary | ICD-10-CM | POA: Diagnosis present

## 2019-04-07 DIAGNOSIS — O09893 Supervision of other high risk pregnancies, third trimester: Secondary | ICD-10-CM | POA: Insufficient documentation

## 2019-04-07 DIAGNOSIS — O34219 Maternal care for unspecified type scar from previous cesarean delivery: Secondary | ICD-10-CM

## 2019-04-07 DIAGNOSIS — O0990 Supervision of high risk pregnancy, unspecified, unspecified trimester: Secondary | ICD-10-CM

## 2019-04-07 DIAGNOSIS — O09899 Supervision of other high risk pregnancies, unspecified trimester: Secondary | ICD-10-CM

## 2019-04-07 DIAGNOSIS — O4440 Low lying placenta NOS or without hemorrhage, unspecified trimester: Secondary | ICD-10-CM

## 2019-04-07 DIAGNOSIS — Z3A00 Weeks of gestation of pregnancy not specified: Secondary | ICD-10-CM | POA: Diagnosis not present

## 2019-04-08 ENCOUNTER — Telehealth: Payer: Self-pay

## 2019-04-08 NOTE — Telephone Encounter (Signed)
Please advise. Thank you

## 2019-04-08 NOTE — Telephone Encounter (Signed)
Pt calling to see if Dr. Jerene Pitch has got her MRI results ??

## 2019-04-11 NOTE — Progress Notes (Signed)
Called and discussed with patient, schedule repeat cesarean section.

## 2019-04-11 NOTE — Telephone Encounter (Signed)
Called and discussed

## 2019-04-13 ENCOUNTER — Telehealth: Payer: Self-pay | Admitting: Obstetrics and Gynecology

## 2019-04-13 NOTE — Telephone Encounter (Signed)
-----   Message from Natale Milch, MD sent at 04/11/2019  8:19 AM EDT ----- Regarding: cesarean section Surgery Booking Request Patient Full Name:  Lori Velasquez  MRN: [id number]  DOB: [birth year]  Surgeon: Natale Milch, MD  Requested Surgery Date and Time: 06/14/2019 Primary Diagnosis AND Code: repeat cesarean section, desires sterilization Secondary Diagnosis and Code:  Surgical Procedure: Cesarean section with tubal ligation L&D Notification: Yes Admission Status: surgery admit Length of Surgery: 1 hour  Special Case Needs: none H&P:TBD(date) Phone Interview???: no Interpreter: Language:  Medical Clearance: no Special Scheduling Instructions: none

## 2019-04-13 NOTE — Telephone Encounter (Signed)
Lmtrc

## 2019-04-14 ENCOUNTER — Ambulatory Visit (INDEPENDENT_AMBULATORY_CARE_PROVIDER_SITE_OTHER): Payer: Medicaid Other | Admitting: Advanced Practice Midwife

## 2019-04-14 ENCOUNTER — Other Ambulatory Visit: Payer: Self-pay

## 2019-04-14 ENCOUNTER — Encounter: Payer: Self-pay | Admitting: Advanced Practice Midwife

## 2019-04-14 DIAGNOSIS — O34219 Maternal care for unspecified type scar from previous cesarean delivery: Secondary | ICD-10-CM

## 2019-04-14 DIAGNOSIS — Z3A30 30 weeks gestation of pregnancy: Secondary | ICD-10-CM | POA: Diagnosis not present

## 2019-04-14 NOTE — Progress Notes (Signed)
ROB- no concerns 

## 2019-04-14 NOTE — Progress Notes (Signed)
Routine Prenatal Care Visit- Virtual Visit  Subjective   Virtual Visit via Telephone Note  I connected with@ on 04/14/19 at  1:30 PM EDT by telephone and verified that I am speaking with the correct person using two identifiers.   I discussed the limitations, risks, security and privacy concerns of performing an evaluation and management service by telephone and the availability of in person appointments. I also discussed with the patient that there may be a patient responsible charge related to this service. The patient expressed understanding and agreed to proceed.  The patient was in her car I spoke with the patient from my  Office phone The names of people involved in this encounter were: the patient Lori Velasquez and myself Tresea Mall, CNM   Lori Velasquez is a 28 y.o. [id number] at [redacted]w[redacted]d being seen today for ongoing prenatal care.  She is currently monitored for the following issues for this high-risk pregnancy and has Iron deficiency anemia; Supervision of high risk pregnancy, antepartum; Short interval between pregnancies affecting pregnancy, antepartum; History of cesarean delivery, antepartum; Group B streptococcal bacteriuria; and Placenta previa antepartum on their problem list.  ----------------------------------------------------------------------------------- Patient reports no complaints.   Contractions: Not present. Vag. Bleeding: None.  Movement: Present. Denies leaking of fluid.  ----------------------------------------------------------------------------------- The following portions of the patient's history were reviewed and updated as appropriate: allergies, current medications, past family history, past medical history, past social history, past surgical history and problem list. Problem list updated.   Objective  Last menstrual period 08/28/2018, not currently breastfeeding. Pregravid weight 167 lb (75.8 kg) Total Weight Gain 22 lb (9.979 kg) Urinalysis:       Fetal Status:     Movement: Present     Physical Exam could not be performed. Because of the COVID-19 outbreak this visit was performed over the phone and not in person.   Assessment   28 y.o. G3P2002 at [redacted]w[redacted]d by  06/19/2019, by Ultrasound presenting for routine prenatal visit  Plan   Pregnancy #3 Problems (from 08/28/18 to present)    Problem Noted Resolved   Group B streptococcal bacteriuria 11/01/2018 by Conard Novak, MD No   Supervision of high risk pregnancy, antepartum 10/25/2018 by Natale Milch, MD No   Overview Addendum 03/31/2019  5:10 PM by Natale Milch, MD      Clinic [facility] Prenatal Labs  Dating  7 wk [place] Blood type: A POS  Genetic Screen  Declines Antibody:Negative (11/18 1455)  Anatomic [place] complete Rubella: 6.30 (11/18 1455) IMMUNE Varicella: Immune  GTT Early:        28 wk:      RPR: Non Reactive (11/18 1455)   Rhogam  not needed HBsAg: Negative (11/18 1455)   TDaP vaccine                       HIV: Non Reactive (11/18 1455)   Flu Shot   10/25/18                             GBS:   Contraception  tubal ligation  [x]  sign BTL consents Pap: 2019 NIL  CBB     CS/VBAC  desires RLTCS   Baby Food     Support Person   FOB: Leotis Shames Mother: Dorita Fray interval between pregnancies affecting pregnancy, antepartum 10/25/2018 by Natale Milch, MD No  History of cesarean delivery, antepartum 10/25/2018 by Natale MilchSchuman, Christanna R, MD No       Gestational age appropriate obstetric precautions including but not limited to vaginal bleeding, contractions, leaking of fluid and fetal movement were reviewed in detail with the patient.     Follow Up Instructions: TDAP and CBC at next visit   I discussed the assessment and treatment plan with the patient. The patient was provided an opportunity to ask questions and all were answered. The patient agreed with the plan and demonstrated an understanding of the instructions.   The  patient was advised to call back or seek an in-person evaluation if the symptoms worsen or if the condition fails to improve as anticipated.  I provided 10 minutes of non-face-to-face time during this encounter.  Return in about 3 weeks (around 05/05/2019) for ROB.  Parke PoissonJane E. Khalila Buechner, CNM [facility] 04/14/2019, 1:37 PM

## 2019-04-14 NOTE — Telephone Encounter (Signed)
Patient is aware of H&P at [facility] on 06/08/19 @ 9:10am w/ Dr. Jerene Pitch, Pre-admit Testing to be scheduled, and OR on 06/14/19.

## 2019-05-05 ENCOUNTER — Encounter: Payer: Self-pay | Admitting: Obstetrics & Gynecology

## 2019-05-05 ENCOUNTER — Other Ambulatory Visit: Payer: Self-pay

## 2019-05-05 ENCOUNTER — Ambulatory Visit (INDEPENDENT_AMBULATORY_CARE_PROVIDER_SITE_OTHER): Payer: Medicaid Other | Admitting: Obstetrics & Gynecology

## 2019-05-05 VITALS — BP 120/80 | Wt 192.0 lb

## 2019-05-05 DIAGNOSIS — O4443 Low lying placenta NOS or without hemorrhage, third trimester: Secondary | ICD-10-CM

## 2019-05-05 DIAGNOSIS — Z23 Encounter for immunization: Secondary | ICD-10-CM

## 2019-05-05 DIAGNOSIS — O99820 Streptococcus B carrier state complicating pregnancy: Secondary | ICD-10-CM

## 2019-05-05 DIAGNOSIS — O34219 Maternal care for unspecified type scar from previous cesarean delivery: Secondary | ICD-10-CM

## 2019-05-05 DIAGNOSIS — O4440 Low lying placenta NOS or without hemorrhage, unspecified trimester: Secondary | ICD-10-CM | POA: Insufficient documentation

## 2019-05-05 DIAGNOSIS — Z3A33 33 weeks gestation of pregnancy: Secondary | ICD-10-CM

## 2019-05-05 DIAGNOSIS — O0990 Supervision of high risk pregnancy, unspecified, unspecified trimester: Secondary | ICD-10-CM

## 2019-05-05 DIAGNOSIS — O99013 Anemia complicating pregnancy, third trimester: Secondary | ICD-10-CM

## 2019-05-05 DIAGNOSIS — O99019 Anemia complicating pregnancy, unspecified trimester: Secondary | ICD-10-CM | POA: Insufficient documentation

## 2019-05-05 NOTE — Patient Instructions (Addendum)
Third Trimester of Pregnancy The third trimester is from week 28 through week 40 (months 7 through 9). The third trimester is a time when the unborn baby (fetus) is growing rapidly. At the end of the ninth month, the fetus is about 20 inches in length and weighs 6-10 pounds. Body changes during your third trimester Your body will continue to go through many changes during pregnancy. The changes vary from woman to woman. During the third trimester:  Your weight will continue to increase. You can expect to gain 25-35 pounds (11-16 kg) by the end of the pregnancy.  You may begin to get stretch marks on your hips, abdomen, and breasts.  You may urinate more often because the fetus is moving lower into your pelvis and pressing on your bladder.  You may develop or continue to have heartburn. This is caused by increased hormones that slow down muscles in the digestive tract.  You may develop or continue to have constipation because increased hormones slow digestion and cause the muscles that push waste through your intestines to relax.  You may develop hemorrhoids. These are swollen veins (varicose veins) in the rectum that can itch or be painful.  You may develop swollen, bulging veins (varicose veins) in your legs.  You may have increased body aches in the pelvis, back, or thighs. This is due to weight gain and increased hormones that are relaxing your joints.  You may have changes in your hair. These can include thickening of your hair, rapid growth, and changes in texture. Some women also have hair loss during or after pregnancy, or hair that feels dry or thin. Your hair will most likely return to normal after your baby is born.  Your breasts will continue to grow and they will continue to become tender. A yellow fluid (colostrum) may leak from your breasts. This is the first milk you are producing for your baby.  Your belly button may stick out.  You may notice more swelling in your hands,  face, or ankles.  You may have increased tingling or numbness in your hands, arms, and legs. The skin on your belly may also feel numb.  You may feel short of breath because of your expanding uterus.  You may have more problems sleeping. This can be caused by the size of your belly, increased need to urinate, and an increase in your body's metabolism.  You may notice the fetus "dropping," or moving lower in your abdomen (lightening).  You may have increased vaginal discharge.  You may notice your joints feel loose and you may have pain around your pelvic bone. What to expect at prenatal visits You will have prenatal exams every 2 weeks until week 36. Then you will have weekly prenatal exams. During a routine prenatal visit:  You will be weighed to make sure you and the baby are growing normally.  Your blood pressure will be taken.  Your abdomen will be measured to track your baby's growth.  The fetal heartbeat will be listened to.  Any test results from the previous visit will be discussed.  You may have a cervical check near your due date to see if your cervix has softened or thinned (effaced).  You will be tested for Group B streptococcus. This happens between 35 and 37 weeks. Your health care provider may ask you:  What your birth plan is.  How you are feeling.  If you are feeling the baby move.  If you have had any abnormal   symptoms, such as leaking fluid, bleeding, severe headaches, or abdominal cramping.  If you are using any tobacco products, including cigarettes, chewing tobacco, and electronic cigarettes.  If you have any questions. Other tests or screenings that may be performed during your third trimester include:  Blood tests that check for low iron levels (anemia).  Fetal testing to check the health, activity level, and growth of the fetus. Testing is done if you have certain medical conditions or if there are problems during the pregnancy.  Nonstress test  (NST). This test checks the health of your baby to make sure there are no signs of problems, such as the baby not getting enough oxygen. During this test, a belt is placed around your belly. The baby is made to move, and its heart rate is monitored during movement. What is false labor? False labor is a condition in which you feel small, irregular tightenings of the muscles in the womb (contractions) that usually go away with rest, changing position, or drinking water. These are called Braxton Hicks contractions. Contractions may last for hours, days, or even weeks before true labor sets in. If contractions come at regular intervals, become more frequent, increase in intensity, or become painful, you should see your health care provider. What are the signs of labor?  Abdominal cramps.  Regular contractions that start at 10 minutes apart and become stronger and more frequent with time.  Contractions that start on the top of the uterus and spread down to the lower abdomen and back.  Increased pelvic pressure and dull back pain.  A watery or bloody mucus discharge that comes from the vagina.  Leaking of amniotic fluid. This is also known as your "water breaking." It could be a slow trickle or a gush. Let your health care provider know if it has a color or strange odor. If you have any of these signs, call your health care provider right away, even if it is before your due date. Follow these instructions at home: Medicines  Follow your health care provider's instructions regarding medicine use. Specific medicines may be either safe or unsafe to take during pregnancy.  Take a prenatal vitamin that contains at least 600 micrograms (mcg) of folic acid.  If you develop constipation, try taking a stool softener if your health care provider approves. Eating and drinking   Eat a balanced diet that includes fresh fruits and vegetables, whole grains, good sources of protein such as meat, eggs, or tofu,  and low-fat dairy. Your health care provider will help you determine the amount of weight gain that is right for you.  Avoid raw meat and uncooked cheese. These carry germs that can cause birth defects in the baby.  If you have low calcium intake from food, talk to your health care provider about whether you should take a daily calcium supplement.  Eat four or five small meals rather than three large meals a day.  Limit foods that are high in fat and processed sugars, such as fried and sweet foods.  To prevent constipation: ? Drink enough fluid to keep your urine clear or pale yellow. ? Eat foods that are high in fiber, such as fresh fruits and vegetables, whole grains, and beans. Activity  Exercise only as directed by your health care provider. Most women can continue their usual exercise routine during pregnancy. Try to exercise for 30 minutes at least 5 days a week. Stop exercising if you experience uterine contractions.  Avoid heavy lifting.  Do   not exercise in extreme heat or humidity, or at high altitudes.  Wear low-heel, comfortable shoes.  Practice good posture.  You may continue to have sex unless your health care provider tells you otherwise. Relieving pain and discomfort  Take frequent breaks and rest with your legs elevated if you have leg cramps or low back pain.  Take warm sitz baths to soothe any pain or discomfort caused by hemorrhoids. Use hemorrhoid cream if your health care provider approves.  Wear a good support bra to prevent discomfort from breast tenderness.  If you develop varicose veins: ? Wear support pantyhose or compression stockings as told by your healthcare provider. ? Elevate your feet for 15 minutes, 3-4 times a day. Prenatal care  Write down your questions. Take them to your prenatal visits.  Keep all your prenatal visits as told by your health care provider. This is important. Safety  Wear your seat belt at all times when driving.  Make  a list of emergency phone numbers, including numbers for family, friends, the hospital, and police and fire departments. General instructions  Avoid cat litter boxes and soil used by cats. These carry germs that can cause birth defects in the baby. If you have a cat, ask someone to clean the litter box for you.  Do not travel far distances unless it is absolutely necessary and only with the approval of your health care provider.  Do not use hot tubs, steam rooms, or saunas.  Do not drink alcohol.  Do not use any products that contain nicotine or tobacco, such as cigarettes and e-cigarettes. If you need help quitting, ask your health care provider.  Do not use any medicinal herbs or unprescribed drugs. These chemicals affect the formation and growth of the baby.  Do not douche or use tampons or scented sanitary pads.  Do not cross your legs for long periods of time.  To prepare for the arrival of your baby: ? Take prenatal classes to understand, practice, and ask questions about labor and delivery. ? Make a trial run to the hospital. ? Visit the hospital and tour the maternity area. ? Arrange for maternity or paternity leave through employers. ? Arrange for family and friends to take care of pets while you are in the hospital. ? Purchase a rear-facing car seat and make sure you know how to install it in your car. ? Pack your hospital bag. ? Prepare the baby's nursery. Make sure to remove all pillows and stuffed animals from the baby's crib to prevent suffocation.  Visit your dentist if you have not gone during your pregnancy. Use a soft toothbrush to brush your teeth and be gentle when you floss. Contact a health care provider if:  You are unsure if you are in labor or if your water has broken.  You become dizzy.  You have mild pelvic cramps, pelvic pressure, or nagging pain in your abdominal area.  You have lower back pain.  You have persistent nausea, vomiting, or  diarrhea.  You have an unusual or bad smelling vaginal discharge.  You have pain when you urinate. Get help right away if:  Your water breaks before 37 weeks.  You have regular contractions less than 5 minutes apart before 37 weeks.  You have a fever.  You are leaking fluid from your vagina.  You have spotting or bleeding from your vagina.  You have severe abdominal pain or cramping.  You have rapid weight loss or weight gain.  You have   shortness of breath with chest pain.  You notice sudden or extreme swelling of your face, hands, ankles, feet, or legs.  Your baby makes fewer than 10 movements in 2 hours.  You have severe headaches that do not go away when you take medicine.  You have vision changes. Summary  The third trimester is from week 28 through week 40, months 7 through 9. The third trimester is a time when the unborn baby (fetus) is growing rapidly.  During the third trimester, your discomfort may increase as you and your baby continue to gain weight. You may have abdominal, leg, and back pain, sleeping problems, and an increased need to urinate.  During the third trimester your breasts will keep growing and they will continue to become tender. A yellow fluid (colostrum) may leak from your breasts. This is the first milk you are producing for your baby.  False labor is a condition in which you feel small, irregular tightenings of the muscles in the womb (contractions) that eventually go away. These are called Braxton Hicks contractions. Contractions may last for hours, days, or even weeks before true labor sets in.  Signs of labor can include: abdominal cramps; regular contractions that start at 10 minutes apart and become stronger and more frequent with time; watery or bloody mucus discharge that comes from the vagina; increased pelvic pressure and dull back pain; and leaking of amniotic fluid. This information is not intended to replace advice given to you by your  health care provider. Make sure you discuss any questions you have with your health care provider. Document Released: 11/18/2001 Document Revised: 12/30/2016 Document Reviewed: 12/30/2016 Elsevier Interactive Patient Education  2019 [employer]  

## 2019-05-05 NOTE — Progress Notes (Signed)
  Subjective  Fetal Movement? yes Contractions? Yes, Braxton Hicks irreg Leaking Fluid? no Vaginal Bleeding? No  Objective  BP 120/80   Wt 192 lb (87.1 kg)   LMP 08/28/2018 (Exact Date)   BMI 38.78 kg/m  General: NAD Pumonary: no increased work of breathing Abdomen: gravid, non-tender Extremities: no edema Psychiatric: mood appropriate, affect full  Assessment  28 y.o. G3P2002 at [redacted]w[redacted]d by  06/19/2019, by Ultrasound presenting for routine prenatal visit  Plan   Problem List Items Addressed This Visit    Supervision of high risk pregnancy, antepartum   History of cesarean delivery, antepartum   Low-lying placenta    Pelvic rest    Monitor for bleeding    Consider CS earlier if bleeding    MRI discussed, neg for accreta   Anemia affecting third pregnancy   Relevant Orders   CBC   [redacted] weeks gestation of pregnancy       PNV, [facility]      Pregnancy #3 Problems (from 08/28/18 to present)    Problem Noted Resolved   Group B streptococcal bacteriuria 11/01/2018 by Conard Novak, MD No   Supervision of high risk pregnancy, antepartum 10/25/2018 by Natale Milch, MD No   Overview Addendum 05/05/2019  3:38 PM by Nadara Mustard, MD      Clinic [facility] Prenatal Labs  Dating  7 wk [place] Blood type: A POS  Genetic Screen  Declines Antibody:Negative (11/18 1455)  Anatomic [place] complete Rubella: 6.30 (11/18 1455) IMMUNE Varicella: Immune  GTT nml      RPR: Non Reactive (11/18 1455)   Rhogam  not needed HBsAg: Negative (11/18 1455)   TDaP vaccine     5/28                  HIV: Non Reactive (11/18 1455)   Flu Shot   10/25/18                             GBS:   Contraception  tubal ligation  [x]  sign BTL consents Pap: 2019 NIL  CBB  no   CS/VBAC  desires RLTCS   Baby Food     Support Person   FOB: Leotis Shames Mother: Dorita Fray interval between pregnancies affecting pregnancy, antepartum 10/25/2018 by Natale Milch, MD No   History of cesarean  delivery, antepartum 10/25/2018 by Natale Milch, MD No   Overview Signed 05/05/2019  3:40 PM by Nadara Mustard, MD    Planned 06/14/19 (39 weeks)          Annamarie Major, MD, [facility], [facility] 05/05/2019  3:50 PM

## 2019-05-06 LAB — CBC
Hematocrit: 40.5 % (ref 34.0–46.6)
Hemoglobin: 12.7 g/dL (ref 11.1–15.9)
MCH: 26.2 pg — ABNORMAL LOW (ref 26.6–33.0)
MCHC: 31.4 g/dL — ABNORMAL LOW (ref 31.5–35.7)
MCV: 84 fL (ref 79–97)
Platelets: 193 10*3/uL (ref 150–450)
RBC: 4.84 x10E6/uL (ref 3.77–5.28)
RDW: 14.1 % (ref 11.7–15.4)
WBC: 10.6 10*3/uL (ref 3.4–10.8)

## 2019-05-19 ENCOUNTER — Other Ambulatory Visit: Payer: Self-pay

## 2019-05-19 ENCOUNTER — Other Ambulatory Visit (HOSPITAL_COMMUNITY)
Admission: RE | Admit: 2019-05-19 | Discharge: 2019-05-19 | Disposition: A | Discharge status: Home / Self Care | Payer: Medicaid Other | Source: Ambulatory Visit | Attending: Obstetrics and Gynecology | Admitting: Obstetrics and Gynecology

## 2019-05-19 ENCOUNTER — Encounter: Payer: Self-pay | Admitting: Obstetrics and Gynecology

## 2019-05-19 ENCOUNTER — Ambulatory Visit (INDEPENDENT_AMBULATORY_CARE_PROVIDER_SITE_OTHER): Payer: Medicaid Other | Admitting: Obstetrics and Gynecology

## 2019-05-19 VITALS — BP 128/82 | Wt 193.0 lb

## 2019-05-19 DIAGNOSIS — O26843 Uterine size-date discrepancy, third trimester: Secondary | ICD-10-CM

## 2019-05-19 DIAGNOSIS — O34219 Maternal care for unspecified type scar from previous cesarean delivery: Secondary | ICD-10-CM | POA: Diagnosis present

## 2019-05-19 DIAGNOSIS — O0990 Supervision of high risk pregnancy, unspecified, unspecified trimester: Secondary | ICD-10-CM | POA: Insufficient documentation

## 2019-05-19 DIAGNOSIS — O0993 Supervision of high risk pregnancy, unspecified, third trimester: Secondary | ICD-10-CM

## 2019-05-19 DIAGNOSIS — O99019 Anemia complicating pregnancy, unspecified trimester: Secondary | ICD-10-CM

## 2019-05-19 DIAGNOSIS — O99820 Streptococcus B carrier state complicating pregnancy: Secondary | ICD-10-CM

## 2019-05-19 DIAGNOSIS — O163 Unspecified maternal hypertension, third trimester: Secondary | ICD-10-CM

## 2019-05-19 DIAGNOSIS — O99013 Anemia complicating pregnancy, third trimester: Secondary | ICD-10-CM

## 2019-05-19 DIAGNOSIS — Z3A35 35 weeks gestation of pregnancy: Secondary | ICD-10-CM

## 2019-05-19 NOTE — Progress Notes (Signed)
Routine Prenatal Care Visit  Subjective  Lori Velasquez is a 28 y.o. G3P2002 at 7355w4d being seen today for ongoing prenatal care.  She is currently monitored for the following issues for this high-risk pregnancy and has Iron deficiency anemia; Supervision of high risk pregnancy, antepartum; Short interval between pregnancies affecting pregnancy, antepartum; History of cesarean delivery, antepartum; Group B streptococcal bacteriuria; Low-lying placenta; and Anemia affecting third pregnancy on their problem list.  ----------------------------------------------------------------------------------- Patient reports headache.  C/o fatigue, having trouble sleeping, pelvic pressure and pain, blurred vision and headaches and swelling in legs and feet . Contractions: Not present. Vag. Bleeding: None.  Movement: Present. Denies leaking of fluid.  ----------------------------------------------------------------------------------- The following portions of the patient's history were reviewed and updated as appropriate: allergies, current medications, past family history, past medical history, past social history, past surgical history and problem list. Problem list updated.   Objective  Blood pressure (!) 142/80, weight 193 lb (87.5 kg), last menstrual period 08/28/2018, not currently breastfeeding. Pregravid weight 167 lb (75.8 kg) Total Weight Gain 26 lb (11.8 kg) Urinalysis:      Fetal Status: Fetal Heart Rate (bpm): 133 Fundal Height: 40 cm Movement: Present     General:  Alert, oriented and cooperative. Patient is in no acute distress.  Skin: Skin is warm and dry. No rash noted.   Cardiovascular: Normal heart rate noted  Respiratory: Normal respiratory effort, no problems with respiration noted  Abdomen: Soft, gravid, appropriate for gestational age. Pain/Pressure: Absent     Pelvic:  Cervical exam deferred Dilation: Closed Effacement (%): 0 Station: -3  Extremities: Normal range of motion.   Edema: Trace  Mental Status: Normal mood and affect. Normal behavior. Normal judgment and thought content.     Assessment   28 y.o. G3P2002 at 3155w4d by  06/19/2019, by Ultrasound presenting for routine prenatal visit  Plan   Pregnancy #3 Problems (from 08/28/18 to present)    Problem Noted Resolved   Group B streptococcal bacteriuria 11/01/2018 by Conard NovakJackson, Stephen D, MD No   Supervision of high risk pregnancy, antepartum 10/25/2018 by Natale MilchSchuman,  R, MD No   Overview Addendum 05/05/2019  3:38 PM by Nadara MustardHarris, Robert P, MD      [facility]inic [facility] Prenatal Labs  Dating  7 wk US Blood type: A POS  Genetic Screen  Declines Antibody:Negative (11/18 1455)  Anatomic US complete Rubella: 6.30 (11/18 1455) IMMUNE Varicella: Immune  GTT nml      RPR: Non Reactive (11/18 1455)   Rhogam  not needed HBsAg: Negative (11/18 1455)   TDaP vaccine     5/28                  HIV: Non Reactive (11/18 1455)   Flu Shot   10/25/18                             GBS:   Contraception  tubal ligation  [x]  sign BTL consents Pap: 2019 NIL  CBB  no   CS/VBAC  desires RLTCS   Baby Food     Support Person   FOB: Leotis ShamesJeffery Mother: Dorita FrayKimberly            Short interval between pregnancies affecting pregnancy, antepartum 10/25/2018 by Natale MilchSchuman,  R, MD No   History of cesarean delivery, antepartum 10/25/2018 by Natale MilchSchuman,  R, MD No   Overview Signed 05/05/2019  3:40 PM by Nadara MustardHarris, Robert P, MD  Planned 06/14/19 (39 weeks)          Gestational age appropriate obstetric precautions including but not limited to vaginal bleeding, contractions, leaking of fluid and fetal movement were reviewed in detail with the patient.    Patient had one elevated BP and one normal.  Discussed tylenol for headache, will take tylenol and see if it goes away.  Discussed preeclampsia warning signs and when to go to the hospital.  Pre-Eclampsia labs today.  Return in about 1 week (around 05/26/2019) for [place] in person  and [place].  Homero Fellers MD [facility], [facility] 05/19/2019, 2:54 PM

## 2019-05-19 NOTE — Progress Notes (Signed)
ROB C/o fatigue, having trouble sleeping, pelvic pressure and pain, blurred vision and headaches and swelling in legs and feet  Denies lof, no vb, Good FM

## 2019-05-20 ENCOUNTER — Other Ambulatory Visit: Payer: Self-pay | Admitting: Obstetrics and Gynecology

## 2019-05-20 DIAGNOSIS — O163 Unspecified maternal hypertension, third trimester: Secondary | ICD-10-CM

## 2019-05-20 LAB — PROTEIN / CREATININE RATIO, URINE
Creatinine, Urine: 34.4 mg/dL
Protein, Ur: 9.7 mg/dL
Protein/Creat Ratio: 282 mg/g creat — ABNORMAL HIGH (ref 0–200)

## 2019-05-20 LAB — COMPREHENSIVE METABOLIC PANEL
ALT: 15 IU/L (ref 0–32)
AST: 20 IU/L (ref 0–40)
Albumin/Globulin Ratio: 1.3 (ref 1.2–2.2)
Albumin: 3.5 g/dL — ABNORMAL LOW (ref 3.9–5.0)
Alkaline Phosphatase: 129 IU/L — ABNORMAL HIGH (ref 39–117)
BUN/Creatinine Ratio: 10 (ref 9–23)
BUN: 5 mg/dL — ABNORMAL LOW (ref 6–20)
Bilirubin Total: 0.3 mg/dL (ref 0.0–1.2)
CO2: 22 mmol/L (ref 20–29)
Calcium: 8.5 mg/dL — ABNORMAL LOW (ref 8.7–10.2)
Chloride: 102 mmol/L (ref 96–106)
Creatinine, Ser: 0.52 mg/dL — ABNORMAL LOW (ref 0.57–1.00)
GFR calc Af Amer: 152 mL/min/{1.73_m2} (ref >59)
GFR calc non Af Amer: 132 mL/min/{1.73_m2} (ref >59)
Globulin, Total: 2.7 g/dL (ref 1.5–4.5)
Glucose: 77 mg/dL (ref 65–99)
Potassium: 3.9 mmol/L (ref 3.5–5.2)
Sodium: 140 mmol/L (ref 134–144)
Total Protein: 6.2 g/dL (ref 6.0–8.5)

## 2019-05-20 LAB — CBC
Hematocrit: 37.4 % (ref 34.0–46.6)
Hemoglobin: 12.3 g/dL (ref 11.1–15.9)
MCH: 27.3 pg (ref 26.6–33.0)
MCHC: 32.9 g/dL (ref 31.5–35.7)
MCV: 83 fL (ref 79–97)
Platelets: 172 10*3/uL (ref 150–450)
RBC: 4.5 x10E6/uL (ref 3.77–5.28)
RDW: 14.9 % (ref 11.7–15.4)
WBC: 10.7 10*3/uL (ref 3.4–10.8)

## 2019-05-20 NOTE — Progress Notes (Signed)
Called and discussed with patient. She will come Monday to the office for a BP check and to pick up a kit for a 24 hour urine collection. She has a mild headache that she says comes and goes. She understands to go to the hospital if her headache is severe or constant. She understands the warning signs of preeclampsia.

## 2019-05-20 NOTE — Progress Notes (Signed)
Patient coming Monday to office between 3:30-4:30 for BP check and to pick up a 24 hour urine kit.

## 2019-05-21 ENCOUNTER — Other Ambulatory Visit: Payer: Self-pay

## 2019-05-21 ENCOUNTER — Observation Stay
Admission: EM | Admit: 2019-05-21 | Discharge: 2019-05-21 | Disposition: A | Discharge status: Home / Self Care | Payer: Medicaid Other | Attending: Certified Nurse Midwife | Admitting: Certified Nurse Midwife

## 2019-05-21 DIAGNOSIS — O99013 Anemia complicating pregnancy, third trimester: Secondary | ICD-10-CM | POA: Diagnosis not present

## 2019-05-21 DIAGNOSIS — J45909 Unspecified asthma, uncomplicated: Secondary | ICD-10-CM | POA: Diagnosis not present

## 2019-05-21 DIAGNOSIS — O99513 Diseases of the respiratory system complicating pregnancy, third trimester: Secondary | ICD-10-CM | POA: Insufficient documentation

## 2019-05-21 DIAGNOSIS — Z888 Allergy status to other drugs, medicaments and biological substances status: Secondary | ICD-10-CM | POA: Diagnosis not present

## 2019-05-21 DIAGNOSIS — Z3A35 35 weeks gestation of pregnancy: Secondary | ICD-10-CM | POA: Insufficient documentation

## 2019-05-21 DIAGNOSIS — R51 Headache: Secondary | ICD-10-CM | POA: Insufficient documentation

## 2019-05-21 DIAGNOSIS — O26893 Other specified pregnancy related conditions, third trimester: Principal | ICD-10-CM | POA: Insufficient documentation

## 2019-05-21 DIAGNOSIS — D509 Iron deficiency anemia, unspecified: Secondary | ICD-10-CM | POA: Insufficient documentation

## 2019-05-21 DIAGNOSIS — R519 Headache, unspecified: Secondary | ICD-10-CM | POA: Diagnosis present

## 2019-05-21 DIAGNOSIS — N858 Other specified noninflammatory disorders of uterus: Secondary | ICD-10-CM | POA: Insufficient documentation

## 2019-05-21 DIAGNOSIS — Z832 Family history of diseases of the blood and blood-forming organs and certain disorders involving the immune mechanism: Secondary | ICD-10-CM | POA: Diagnosis not present

## 2019-05-21 DIAGNOSIS — O99213 Obesity complicating pregnancy, third trimester: Secondary | ICD-10-CM | POA: Diagnosis not present

## 2019-05-21 LAB — COMPREHENSIVE METABOLIC PANEL
ALT: 18 U/L (ref 0–44)
AST: 21 U/L (ref 15–41)
Albumin: 2.9 g/dL — ABNORMAL LOW (ref 3.5–5.0)
Alkaline Phosphatase: 114 U/L (ref 38–126)
Anion gap: 10 (ref 5–15)
BUN: 10 mg/dL (ref 6–20)
CO2: 24 mmol/L (ref 22–32)
Calcium: 8.7 mg/dL — ABNORMAL LOW (ref 8.9–10.3)
Chloride: 103 mmol/L (ref 98–111)
Creatinine, Ser: 0.51 mg/dL (ref 0.44–1.00)
GFR calc Af Amer: >60 mL/min (ref >60)
GFR calc non Af Amer: >60 mL/min (ref >60)
Glucose, Bld: 94 mg/dL (ref 70–99)
Potassium: 3.5 mmol/L (ref 3.5–5.1)
Sodium: 137 mmol/L (ref 135–145)
Total Bilirubin: 0.2 mg/dL — ABNORMAL LOW (ref 0.3–1.2)
Total Protein: 6.3 g/dL — ABNORMAL LOW (ref 6.5–8.1)

## 2019-05-21 LAB — CBC
HCT: 36.6 % (ref 36.0–46.0)
Hemoglobin: 11.7 g/dL — ABNORMAL LOW (ref 12.0–15.0)
MCH: 26.2 pg (ref 26.0–34.0)
MCHC: 32 g/dL (ref 30.0–36.0)
MCV: 82.1 fL (ref 80.0–100.0)
Platelets: 177 10*3/uL (ref 150–400)
RBC: 4.46 MIL/uL (ref 3.87–5.11)
RDW: 15.3 % (ref 11.5–15.5)
WBC: 11 10*3/uL — ABNORMAL HIGH (ref 4.0–10.5)
nRBC: 0 % (ref 0.0–0.2)

## 2019-05-21 LAB — PROTEIN / CREATININE RATIO, URINE
Creatinine, Urine: 60 mg/dL
Protein Creatinine Ratio: 0.12 mg/mg{Cre} (ref 0.00–0.15)
Total Protein, Urine: 7 mg/dL

## 2019-05-21 MED ORDER — ACETAMINOPHEN 500 MG PO TABS
1000.0000 mg | ORAL_TABLET | Freq: Four times a day (QID) | ORAL | Status: DC | PRN
  Administered 2019-05-21: 1000 mg via ORAL
  Filled 2019-05-21: qty 2

## 2019-05-21 NOTE — OB Triage Note (Signed)
Pt c/o 9/10 H/A that started 6/12 2100.  Also c/o CTX and swelling.  Pt reports normal fetal movement.  No LOF or bleeding.

## 2019-05-21 NOTE — Final Progress Note (Signed)
Physician Final Progress Note  Patient ID: Lori Velasquez MRN: [id number] DOB/AGE: 05/27/1991 28 y.o.  Admit date: 05/21/2019 Admitting provider: Vena AustriaAndreas Staebler, MD/ Gasper Lloydolleen L. Sharen HonesGutierrez, CNM Discharge date: 05/21/2019   Admission Diagnoses: IUP at 35wk 6d with headache  Discharge Diagnoses:  Same as above  Consults: None  Significant Findings/ Diagnostic Studies:    History of Present Illness:  Chief Complaint:  c/o 9/10 H/A that started 6/12 2100.  Also c/o CTX and swelling. HPI:  Lori PhoenixLori D Kuhl is a 28 y.o. 213P2002 female with EDC=06/19/2019  at 664w6d dated by a 7wk1d ultrasound.  Her pregnancy has been complicated by asthma, obesity, close spacing of pregnancies, two prior Cesarean sections, a low lying anterior placenta, GBS bacteruria, and iron deficency anemia.  She presents to L&D with above complaints. Her headache is frontal and occipital. She last took some Tylenol last night. No nausea/vomiting, but does admit some blurred vision. No CP or SOB. Baby active. Reports discomfort in right upper abdomen radiating laterally. See ijn the office 6/11 for similar complaints. BP at that time 142/80. Preeclampsia labs then normal. PC ratio was 282 mgm. She was advised to return to office 6/15 for BP check and to pick up container for a 24 hour urine.    Prenatal care site: [facility] for   [facility] Prenatal Labs  Dating  7 wk US Blood type: A POS  Genetic Screen  Declines Antibody:Negative (11/18 1455)  Anatomic US complete Rubella: 6.30 (11/18 1455) IMMUNE Varicella: Immune  GTT nml      RPR: Non Reactive (11/18 1455)   Rhogam  not needed HBsAg: Negative (11/18 1455)   TDaP vaccine     5/28                  HIV: Non Reactive (11/18 1455)   Flu Shot   10/25/18                             GBS: +bacteruria  Contraception  tubal ligation  [x]  sign BTL consents Pap: 2019 NIL  CBB  no   CS/VBAC  desires RLTCS   Baby Food     Support  Person   FOB: Leotis ShamesJeffery Mother: Cala BradfordKimberly          Maternal Medical History:   Past Medical History:  Diagnosis Date  . Anemia   . Asthma   . Heart murmur    cardiac US done during last pregnancy   . Iron deficiency anemia 03/11/2018  . Short interval between pregnancies affecting pregnancy in first trimester, antepartum 08/07/2017  . Supervision of high risk pregnancy, antepartum 08/07/2017   [facility] Prenatal Labs Dating LMP Blood type: A/Positive/-- (08/31 1557)  Genetic Screen Declines Antibody:Negative (08/31 1557) Anatomic US  Rubella:  Varicella:   GTT Early:               Third trimester: 105 RPR: Non Reactive (08/31 1557)  Rhogam  not needed HBsAg: Negative (08/31 1557)  TDaP vaccine                       Flu Shot: HIV:   neg Baby Food    Breast/Bottle                       Past Surgical History:  Procedure Laterality Date  . CESAREAN SECTION    .  CESAREAN SECTION N/A 04/01/2018   Procedure: CESAREAN SECTION;  Surgeon: Homero Fellers, MD;  Location: [facility] ORS;  Service: Obstetrics;  Laterality: N/A;  . surgery for esophagela atresia as infant      Allergies  Allergen Reactions  . Peppermint Oil   . Diphenhydramine Hcl     Told she had some reaction as a young child    Prior to Admission medications   Medication Sig Start Date End Date Taking? Authorizing Provider  Prenatal 27-1 MG TABS TAKE 1 TABLET BY MOUTH ONCE DAILY 03/24/19  Yes Dalia Heading, CNM  Prenatal MV-Min-FA-Omega-3 (PRENATAL GUMMIES/DHA & FA) 0.4-32.5 MG CHEW Chew by mouth.    [provider]       Social History: She  reports that she has never smoked. She has never used smokeless tobacco. She reports that she does not drink alcohol or use drugs.  Family History: family history includes Anemia in her mother; Diabetes in her maternal grandfather.   Review of Systems: Negative x 10 systems reviewed except as noted in the HPI.      Physical Exam:  Vital Signs: BP 129/60    Pulse (!) 110   Temp 98.2 F (36.8 C) (Oral)   Resp 18   LMP 08/28/2018 (Exact Date)   Patient Vitals for the past 24 hrs:  BP Temp Temp src Pulse Resp  05/21/19 2256 132/63 - - (!) 110 -  05/21/19 2241 129/60 - - (!) 110 -  05/21/19 2228 124/64 - - (!) 117 -  05/21/19 2211 127/60 - - (!) 112 -  05/21/19 2158 128/68 98.2 F (36.8 C) Oral (!) 126 18   General: appears comfortable in NAD, does not appear ill HEENT: normocephalic, atraumatic Heart: mild tachycardia with regular rhythm  No murmurs/rubs/gallops Lungs: clear to auscultation bilaterally Abdomen: soft, gravid, non-tender to palpation Pelvic:  Extremities: non-tender, symmetric, trace edema bilaterally.  DTRs: +1  Neurologic: Alert & oriented x 3.    Baseline FHR: baseline 135-140 with accelerations to 160s to 180, moderate variability Toco: no contractions notes, mild uterine irritability    Bedside Ultrasound: LOA position   Results for orders placed or performed during the hospital encounter of 05/21/19 (from the past 24 hour(s))  CBC     Status: Abnormal   Collection Time: 05/21/19 10:25 PM  Result Value Ref Range   WBC 11.0 (H) 4.0 - 10.5 K/uL   RBC 4.46 3.87 - 5.11 MIL/uL   Hemoglobin 11.7 (L) 12.0 - 15.0 g/dL   HCT 36.6 36.0 - 46.0 %   MCV 82.1 80.0 - 100.0 fL   MCH 26.2 26.0 - 34.0 pg   MCHC 32.0 30.0 - 36.0 g/dL   RDW 15.3 11.5 - 15.5 %   Platelets 177 150 - 400 K/uL   nRBC 0.0 0.0 - 0.2 %  Comprehensive metabolic panel     Status: Abnormal   Collection Time: 05/21/19 10:25 PM  Result Value Ref Range   Sodium 137 135 - 145 mmol/L   Potassium 3.5 3.5 - 5.1 mmol/L   Chloride 103 98 - 111 mmol/L   CO2 24 22 - 32 mmol/L   Glucose, Bld 94 70 - 99 mg/dL   BUN 10 6 - 20 mg/dL   Creatinine, Ser 0.51 0.44 - 1.00 mg/dL   Calcium 8.7 (L) 8.9 - 10.3 mg/dL   Total Protein 6.3 (L) 6.5 - 8.1 g/dL   Albumin 2.9 (L) 3.5 - 5.0 g/dL   AST 21 15 - 41  U/L   ALT 18 0 - 44 U/L   Alkaline Phosphatase 114 38 - 126  U/L   Total Bilirubin 0.2 (L) 0.3 - 1.2 mg/dL   GFR calc non Af Amer >60 >60 mL/min   GFR calc Af Amer >60 >60 mL/min   Anion gap 10 5 - 15  Protein / creatinine ratio, urine     Status: None   Collection Time: 05/21/19 10:26 PM  Result Value Ref Range   Creatinine, Urine 60 mg/dL   Total Protein, Urine 7 mg/dL   Protein Creatinine Ratio 0.12 0.00 - 0.15 mg/mg[Cre]   Assessment:  Lori PhoenixLori D Spruce is a 28 y.o. 583P2002 female at 1870w6d with headache (tension headache?). Normotensive with normal preeclampsia labs. No evidence of preeclampsia or labor  Discharge home with labor and preeclampsia precautions.  Procedures: none  Discharge Condition: stable  Disposition: Discharge disposition: 01-Home or Self Care       Diet: Regular diet  Discharge Activity: Activity as tolerated  Discharge Instructions    Discharge patient   Complete by: As directed    Discharge disposition: 01-Home or Self Care   Discharge patient date: 05/21/2019     Allergies as of 05/21/2019      Reactions   Peppermint Oil    Diphenhydramine Hcl    Told she had some reaction as a young child      Medication List    STOP taking these medications   Prenatal Gummies/DHA & FA 0.4-32.5 MG Chew     TAKE these medications   Prenatal 27-1 MG Tabs TAKE 1 TABLET BY MOUTH ONCE DAILY      Follow up in office 05/23/2019  Total time spent taking care of this patient: 15 minutes  Signed: Farrel Connersolleen Ilyas Lipsitz 05/21/2019, 11:25 PM

## 2019-05-21 NOTE — Discharge Instructions (Signed)
Please return to labor and delivery if symptoms of preeclampsia worsen, like headache, visual changes, CP, SOB, nausea/vomiting, or pain in right upper abdomen not related to fetal movement.  Also return for decreased fetal movement, strong regular contractions every 5 minutes or closer for 1 hour, leaking water from the vagina, or bleeding like a period. Labor and delivery number is [phone]

## 2019-05-22 LAB — CULTURE, BETA STREP (GROUP B ONLY): Strep Gp B Culture: POSITIVE — AB

## 2019-05-23 ENCOUNTER — Other Ambulatory Visit: Payer: Self-pay

## 2019-05-23 ENCOUNTER — Ambulatory Visit: Payer: Medicaid Other

## 2019-05-23 ENCOUNTER — Other Ambulatory Visit: Payer: Medicaid Other

## 2019-05-23 VITALS — BP 132/68 | HR 104

## 2019-05-23 DIAGNOSIS — O163 Unspecified maternal hypertension, third trimester: Secondary | ICD-10-CM

## 2019-05-23 DIAGNOSIS — O139 Gestational [pregnancy-induced] hypertension without significant proteinuria, unspecified trimester: Secondary | ICD-10-CM

## 2019-05-23 LAB — CERVICOVAGINAL ANCILLARY ONLY
Chlamydia: NEGATIVE
Neisseria Gonorrhea: NEGATIVE

## 2019-05-25 ENCOUNTER — Other Ambulatory Visit: Payer: Self-pay | Admitting: Obstetrics and Gynecology

## 2019-05-26 LAB — PROTEIN, URINE, 24 HOUR
Protein, 24H Urine: 93 mg/24 hr (ref 30–150)
Protein, Ur: 9.8 mg/dL

## 2019-05-27 ENCOUNTER — Ambulatory Visit (INDEPENDENT_AMBULATORY_CARE_PROVIDER_SITE_OTHER): Payer: Medicaid Other

## 2019-05-27 ENCOUNTER — Ambulatory Visit (INDEPENDENT_AMBULATORY_CARE_PROVIDER_SITE_OTHER): Payer: Medicaid Other | Admitting: Advanced Practice Midwife

## 2019-05-27 ENCOUNTER — Encounter: Payer: Self-pay | Admitting: Advanced Practice Midwife

## 2019-05-27 ENCOUNTER — Other Ambulatory Visit: Payer: Self-pay

## 2019-05-27 VITALS — BP 120/80 | Wt 193.0 lb

## 2019-05-27 DIAGNOSIS — O34219 Maternal care for unspecified type scar from previous cesarean delivery: Secondary | ICD-10-CM

## 2019-05-27 DIAGNOSIS — O26843 Uterine size-date discrepancy, third trimester: Secondary | ICD-10-CM

## 2019-05-27 DIAGNOSIS — Z3A36 36 weeks gestation of pregnancy: Secondary | ICD-10-CM

## 2019-05-27 DIAGNOSIS — Z113 Encounter for screening for infections with a predominantly sexual mode of transmission: Secondary | ICD-10-CM

## 2019-05-27 DIAGNOSIS — Z3685 Encounter for antenatal screening for Streptococcus B: Secondary | ICD-10-CM

## 2019-05-27 LAB — CREATININE CLEARANCE, URINE, 24 HOUR
Creatinine Clearance: 124 mL/min (ref 88–128)
Creatinine, 24H Ur: 926 mg/24 hr (ref 800–1800)
Creatinine, Ser: 0.52 mg/dL — ABNORMAL LOW (ref 0.57–1.00)
Creatinine, Urine: 97.5 mg/dL
GFR calc Af Amer: 152 mL/min/{1.73_m2} (ref >59)
GFR calc non Af Amer: 132 mL/min/{1.73_m2} (ref >59)

## 2019-05-27 NOTE — Progress Notes (Signed)
Routine Prenatal Care Visit  Subjective  Lori Velasquez is a 28 y.o. G3P2002 at 977w5d being seen today for ongoing prenatal care.  She is currently monitored for the following issues for this high-risk pregnancy and has Iron deficiency anemia; Supervision of high risk pregnancy, antepartum; Short interval between pregnancies affecting pregnancy, antepartum; History of cesarean delivery, antepartum; Group B streptococcal bacteriuria; Low-lying placenta; Anemia affecting third pregnancy; and Headache in pregnancy, antepartum, third trimester on their problem list.  ----------------------------------------------------------------------------------- Patient reports occasional headaches resolve with tylenol. No other s/s preeclampsia. Reviewed today's ultrasound and recent lab work. Baby is moving well.   Contractions: Irregular. Vag. Bleeding: None.  Movement: Present. Denies leaking of fluid.   ----------------------------------------------------------------------------------- The following portions of the patient's history were reviewed and updated as appropriate: allergies, current medications, past family history, past medical history, past social history, past surgical history and problem list. Problem list updated.   Objective  Blood pressure 120/80, weight 193 lb (87.5 kg), last menstrual period 08/28/2018, not currently breastfeeding. Pregravid weight 167 lb (75.8 kg) Total Weight Gain 26 lb (11.8 kg) Urinalysis: Urine Protein    Urine Glucose    Fetal Status: Fetal Heart Rate (bpm): 134   Movement: Present     Follow up ultrasound today: growth 76.5%, 7 pounds 7 ounces, AFI 13.9, Cephalic   General:  Alert, oriented and cooperative. Patient is in no acute distress.  Skin: Skin is warm and dry. No rash noted.   Cardiovascular: Normal heart rate noted  Respiratory: Normal respiratory effort, no problems with respiration noted  Abdomen: Soft, gravid, appropriate for gestational age.  Pain/Pressure: Present     Pelvic:  Cervical exam deferred        Extremities: Normal range of motion.  Edema: None  Mental Status: Normal mood and affect. Normal behavior. Normal judgment and thought content.   Labs from 2 days ago: Results for Lori PhoenixMARTIN, Marce D (MRN [id number]) as of 05/27/2019 14:49  Ref. Range 05/25/2019 16:15 05/25/2019 16:19  GFR, Est Non African American Latest Ref Range: >59 mL/min/1.73  132  GFR, Est African American Latest Ref Range: >59 mL/min/1.73  152  Protein Urine Random Latest Ref Range: Not Estab. mg/dL 9.8   Creatinine, Urine Latest Ref Range: Not Estab. mg/dL  57.897.5  IONG,29BMProt,24hr calculated Latest Ref Range: 30 - 150 mg/24 hr 93   Creatinine, 24H Ur Latest Ref Range: 800 - 1,800 mg/24 hr  926  Creatinine Clearance Latest Ref Range: 88 - 128 mL/min  124    Assessment   27 y.o. G3P2002 at 1477w5d by  06/19/2019, by Ultrasound presenting for routine prenatal visit  Plan   Pregnancy #3 Problems (from 08/28/18 to present)    Problem Noted Resolved   Group B streptococcal bacteriuria 11/01/2018 by Conard NovakJackson, Stephen D, MD No   Supervision of high risk pregnancy, antepartum 10/25/2018 by Natale MilchSchuman, Christanna R, MD No   Overview Addendum 05/05/2019  3:38 PM by Nadara MustardHarris, Robert P, MD      [facility] Prenatal Labs  Dating  7 wk US Blood type: A POS  Genetic Screen  Declines Antibody:Negative (11/18 1455)  Anatomic US complete Rubella: 6.30 (11/18 1455) IMMUNE Varicella: Immune  GTT nml      RPR: Non Reactive (11/18 1455)   Rhogam  not needed HBsAg: Negative (11/18 1455)   TDaP vaccine     5/28                  HIV: Non Reactive (11/18 1455)  Flu Shot   10/25/18                             GBS:   Contraception  tubal ligation  [x]  sign BTL consents Pap: 2019 NIL  CBB  no   CS/VBAC  desires RLTCS   Baby Food     Support Person   FOB: Jacqulynn Cadet Mother: Ovid Curd interval between pregnancies affecting pregnancy, antepartum 10/25/2018 by Homero Fellers, MD No   History of cesarean delivery, antepartum 10/25/2018 by Homero Fellers, MD No   Overview Signed 05/05/2019  3:40 PM by Gae Dry, MD    Planned 06/14/19 (39 weeks)          Preterm labor symptoms and general obstetric precautions including but not limited to vaginal bleeding, contractions, leaking of fluid and fetal movement were reviewed in detail with the patient. Please refer to After Visit Summary for other counseling recommendations.   Return in about 1 week (around 06/03/2019) for rob.  Rod Can, CNM 05/27/2019 3:05 PM

## 2019-05-27 NOTE — Progress Notes (Signed)
Normal, called and left message.

## 2019-05-27 NOTE — Progress Notes (Signed)
WNL. Called and left message.

## 2019-06-02 ENCOUNTER — Other Ambulatory Visit: Payer: Self-pay

## 2019-06-02 ENCOUNTER — Ambulatory Visit (INDEPENDENT_AMBULATORY_CARE_PROVIDER_SITE_OTHER): Payer: Medicaid Other | Admitting: Obstetrics and Gynecology

## 2019-06-02 ENCOUNTER — Encounter: Payer: Self-pay | Admitting: Obstetrics and Gynecology

## 2019-06-02 VITALS — BP 128/74 | Wt 195.0 lb

## 2019-06-02 DIAGNOSIS — O4443 Low lying placenta NOS or without hemorrhage, third trimester: Secondary | ICD-10-CM

## 2019-06-02 DIAGNOSIS — O34219 Maternal care for unspecified type scar from previous cesarean delivery: Secondary | ICD-10-CM

## 2019-06-02 DIAGNOSIS — O0993 Supervision of high risk pregnancy, unspecified, third trimester: Secondary | ICD-10-CM

## 2019-06-02 DIAGNOSIS — O99013 Anemia complicating pregnancy, third trimester: Secondary | ICD-10-CM

## 2019-06-02 DIAGNOSIS — R8271 Bacteriuria: Secondary | ICD-10-CM

## 2019-06-02 DIAGNOSIS — O09899 Supervision of other high risk pregnancies, unspecified trimester: Secondary | ICD-10-CM

## 2019-06-02 DIAGNOSIS — O4440 Low lying placenta NOS or without hemorrhage, unspecified trimester: Secondary | ICD-10-CM

## 2019-06-02 DIAGNOSIS — O09893 Supervision of other high risk pregnancies, third trimester: Secondary | ICD-10-CM

## 2019-06-02 DIAGNOSIS — O0990 Supervision of high risk pregnancy, unspecified, unspecified trimester: Secondary | ICD-10-CM

## 2019-06-02 DIAGNOSIS — Z3A37 37 weeks gestation of pregnancy: Secondary | ICD-10-CM

## 2019-06-02 DIAGNOSIS — O99019 Anemia complicating pregnancy, unspecified trimester: Secondary | ICD-10-CM

## 2019-06-02 LAB — POCT URINALYSIS DIPSTICK OB
Glucose, UA: NEGATIVE
POC,PROTEIN,UA: NEGATIVE

## 2019-06-02 NOTE — Progress Notes (Signed)
Routine Prenatal Care Visit  Subjective  Lori Velasquez is a 28 y.o. G3P2002 at [redacted]w[redacted]d being seen today for ongoing prenatal care.  She is currently monitored for the following issues for this high-risk pregnancy and has Iron deficiency anemia; Supervision of high risk pregnancy, antepartum; Short interval between pregnancies affecting pregnancy, antepartum; History of cesarean delivery, antepartum; Group B streptococcal bacteriuria; Low-lying placenta; Anemia affecting third pregnancy; and Headache in pregnancy, antepartum, third trimester on their problem list.  ----------------------------------------------------------------------------------- Patient reports no complaints.   Contractions: Irregular. Vag. Bleeding: None.  Movement: Present. Denies leaking of fluid.  ----------------------------------------------------------------------------------- The following portions of the patient's history were reviewed and updated as appropriate: allergies, current medications, past family history, past medical history, past social history, past surgical history and problem list. Problem list updated.   Objective  Blood pressure 128/74, weight 195 lb (88.5 kg), last menstrual period 08/28/2018, not currently breastfeeding. Pregravid weight 167 lb (75.8 kg) Total Weight Gain 28 lb (12.7 kg) Urinalysis: Urine Protein Negative  Urine Glucose Negative  Fetal Status: Fetal Heart Rate (bpm): 136   Movement: Present  Presentation: Vertex  General:  Alert, oriented and cooperative. Patient is in no acute distress.  Skin: Skin is warm and dry. No rash noted.   Cardiovascular: Normal heart rate noted  Respiratory: Normal respiratory effort, no problems with respiration noted  Abdomen: Soft, gravid, appropriate for gestational age. Pain/Pressure: Absent     Pelvic:  Cervical exam deferred        Extremities: Normal range of motion.  Edema: None  Mental Status: Normal mood and affect. Normal behavior. Normal  judgment and thought content.   Assessment   28 y.o. G3P2002 at [redacted]w[redacted]d by  06/19/2019, by Ultrasound presenting for routine prenatal visit  Plan   Pregnancy #3 Problems (from 08/28/18 to present)    Problem Noted Resolved   Group B streptococcal bacteriuria 11/01/2018 by Will Bonnet, MD No   Supervision of high risk pregnancy, antepartum 10/25/2018 by Homero Fellers, MD No   Overview Addendum 05/05/2019  3:38 PM by Gae Dry, MD      [facility] Prenatal Labs  Dating  7 wk [place] Blood type: A POS  Genetic Screen  Declines Antibody:Negative (11/18 1455)  Anatomic [place] complete Rubella: 6.30 (11/18 1455) IMMUNE Varicella: Immune  GTT nml      RPR: Non Reactive (11/18 1455)   Rhogam  not needed HBsAg: Negative (11/18 1455)   TDaP vaccine     5/28                  HIV: Non Reactive (11/18 1455)   Flu Shot   10/25/18                             GBS:   Contraception  tubal ligation  [x]  sign BTL consents Pap: 2019 NIL  CBB  no   CS/VBAC  desires RLTCS   Baby Food     Support Person   FOB: Jacqulynn Cadet Mother: Ovid Curd interval between pregnancies affecting pregnancy, antepartum 10/25/2018 by Homero Fellers, MD No   History of cesarean delivery, antepartum 10/25/2018 by Homero Fellers, MD No   Overview Signed 05/05/2019  3:40 PM by Gae Dry, MD    Planned 06/14/19 (39 weeks)          Term labor symptoms and general  obstetric precautions including but not limited to vaginal bleeding, contractions, leaking of fluid and fetal movement were reviewed in detail with the patient. Please refer to After Visit Summary for other counseling recommendations.   Return in about 1 week (around 06/09/2019) for Routine Prenatal Appointment (Keep previously scheduled appt).  Thomasene MohairStephen Jelesa Mangini, MD, Merlinda FrederickFACOG [facility], [facility] 06/02/2019 6:10 PM

## 2019-06-08 ENCOUNTER — Encounter: Payer: Medicaid Other | Admitting: Obstetrics and Gynecology

## 2019-06-08 ENCOUNTER — Ambulatory Visit (INDEPENDENT_AMBULATORY_CARE_PROVIDER_SITE_OTHER): Payer: Medicaid Other | Admitting: Obstetrics and Gynecology

## 2019-06-08 ENCOUNTER — Other Ambulatory Visit: Payer: Self-pay

## 2019-06-08 ENCOUNTER — Encounter
Admission: RE | Admit: 2019-06-08 | Discharge: 2019-06-08 | Disposition: A | Discharge status: Home / Self Care | Payer: Medicaid Other | Source: Ambulatory Visit | Attending: Nephrology | Admitting: Nephrology

## 2019-06-08 ENCOUNTER — Encounter: Payer: Self-pay | Admitting: Obstetrics and Gynecology

## 2019-06-08 VITALS — BP 122/68 | HR 114 | Ht 59.0 in | Wt 198.0 lb

## 2019-06-08 DIAGNOSIS — Z3A38 38 weeks gestation of pregnancy: Secondary | ICD-10-CM

## 2019-06-08 DIAGNOSIS — O139 Gestational [pregnancy-induced] hypertension without significant proteinuria, unspecified trimester: Secondary | ICD-10-CM

## 2019-06-08 DIAGNOSIS — O133 Gestational [pregnancy-induced] hypertension without significant proteinuria, third trimester: Secondary | ICD-10-CM

## 2019-06-08 DIAGNOSIS — O0993 Supervision of high risk pregnancy, unspecified, third trimester: Secondary | ICD-10-CM

## 2019-06-08 DIAGNOSIS — O09899 Supervision of other high risk pregnancies, unspecified trimester: Secondary | ICD-10-CM

## 2019-06-08 DIAGNOSIS — O09893 Supervision of other high risk pregnancies, third trimester: Secondary | ICD-10-CM

## 2019-06-08 DIAGNOSIS — O34219 Maternal care for unspecified type scar from previous cesarean delivery: Secondary | ICD-10-CM

## 2019-06-08 DIAGNOSIS — O0990 Supervision of high risk pregnancy, unspecified, unspecified trimester: Secondary | ICD-10-CM

## 2019-06-08 NOTE — H&P (View-Only) (Signed)
 Patient ID: Lori Velasquez, female   DOB: 04/03/1991, 28 y.o.   MRN: [id number]  Reason for Consult: Pre-op Exam   Referred by No ref. provider found  Subjective:     HPI:  Lori Velasquez is a 28 y.o. female. She is   Pregnancy #3 Problems (from 08/28/18 to present)    Problem Noted Resolved   Group B streptococcal bacteriuria 11/01/2018 by Jackson, Stephen D, MD No   Supervision of high risk pregnancy, antepartum 10/25/2018 by Schuman, Christanna R, MD No   Overview Addendum 05/05/2019  3:38 PM by Harris, Robert P, MD      [facility] Prenatal Labs  Dating  7 wk US Blood type: A POS  Genetic Screen  Declines Antibody:Negative (11/18 1455)  Anatomic US complete Rubella: 6.30 (11/18 1455) IMMUNE Varicella: Immune  GTT nml      RPR: Non Reactive (11/18 1455)   Rhogam  not needed HBsAg: Negative (11/18 1455)   TDaP vaccine     5/28                  HIV: Non Reactive (11/18 1455)   Flu Shot   10/25/18                             GBS:   Contraception  tubal ligation  [x] sign BTL consents Pap: 2019 NIL  CBB  no   CS/VBAC  desires RLTCS   Baby Food     Support Person   FOB: Jeffery Mother: Kimberly            Short interval between pregnancies affecting pregnancy, antepartum 10/25/2018 by Schuman, Christanna R, MD No   History of cesarean delivery, antepartum 10/25/2018 by Schuman, Christanna R, MD No   Overview Signed 05/05/2019  3:40 PM by Harris, Robert P, MD    Planned 06/14/19 (39 weeks)          Past Medical History:  Diagnosis Date  . Anemia   . Asthma   . Heart murmur    cardiac US done during last pregnancy   . Iron deficiency anemia 03/11/2018  . Short interval between pregnancies affecting pregnancy in first trimester, antepartum 08/07/2017  . Supervision of high risk pregnancy, antepartum 08/07/2017   [facility] Prenatal Labs Dating LMP Blood type: A/Positive/-- (08/31 1557)  Genetic Screen Declines Antibody:Negative (08/31 1557) Anatomic US  Rubella:   Varicella:   GTT Early:               Third trimester: 105 RPR: Non Reactive (08/31 1557)  Rhogam  not needed HBsAg: Negative (08/31 1557)  TDaP vaccine                       Flu Shot: HIV:   neg Baby Food    Breast/Bottle                      Family History  Problem Relation Age of Onset  . Anemia Mother   . Diabetes Maternal Grandfather    Past Surgical History:  Procedure Laterality Date  . CESAREAN SECTION    . CESAREAN SECTION N/A 04/01/2018   Procedure: CESAREAN SECTION;  Surgeon: Schuman, Christanna R, MD;  Location: [facility] ORS;  Service: Obstetrics;  Laterality: N/A;  . surgery for esophagela atresia as infant      Short Social History:  Social History   Tobacco   Use  . Smoking status: Never Smoker  . Smokeless tobacco: Never Used  Substance Use Topics  . Alcohol use: No    Allergies  Allergen Reactions  . Peppermint Oil   . Diphenhydramine Hcl     Told she had some reaction as a young child    Current Outpatient Medications  Medication Sig Dispense Refill  . Prenatal 27-1 MG TABS TAKE 1 TABLET BY MOUTH ONCE DAILY 90 each 11   No current facility-administered medications for this visit.     Review of Systems  Constitutional: Negative for chills, fatigue, fever and unexpected weight change.  HENT: Negative for trouble swallowing.  Eyes: Negative for loss of vision.  Respiratory: Negative for cough, shortness of breath and wheezing.  Cardiovascular: Negative for chest pain, leg swelling, palpitations and syncope.  GI: Negative for abdominal pain, blood in stool, diarrhea, nausea and vomiting.  GU: Negative for difficulty urinating, dysuria, frequency and hematuria.  Musculoskeletal: Negative for back pain, leg pain and joint pain.  Skin: Negative for rash.  Neurological: Negative for dizziness, headaches, light-headedness, numbness and seizures.  Psychiatric: Negative for behavioral problem, confusion, depressed mood and sleep disturbance.        Objective:   Objective   Vitals:   06/08/19 0910  BP: 122/68  Pulse: (!) 114  Weight: 198 lb (89.8 kg)  Height: 4\' 11"  (1.499 m)   Body mass index is 39.99 kg/m.  Physical Exam Vitals signs and nursing note reviewed.  Constitutional:      Appearance: She is well-developed.  HENT:     Head: Normocephalic and atraumatic.  Eyes:     Pupils: Pupils are equal, round, and reactive to light.  Cardiovascular:     Rate and Rhythm: Normal rate and regular rhythm.  Pulmonary:     Effort: Pulmonary effort is normal. No respiratory distress.  Abdominal:     General: There is no distension.     Palpations: Abdomen is soft.  Skin:    General: Skin is warm and dry.  Neurological:     Mental Status: She is alert and oriented to person, place, and time.  Psychiatric:        Behavior: Behavior normal.        Thought Content: Thought content normal.        Judgment: Judgment normal.   FHR: 140bpm      Assessment/Plan:     28 yo G3P2002 [redacted]w[redacted]d Hx of prior LTCS- plan for repeat cesarean next week. Discussed risk and benefits including blood loss, infection and damage to surrounding organs. Risk of cesarean hysterectomy discussed.  Desires sterilization by bilateral tubal ligation.  Consents signed.  Adrian Prows MD [facility], [facility] 06/08/2019 10:22 PM

## 2019-06-08 NOTE — Patient Instructions (Signed)
Your procedure is scheduled on: 06/14/19 0545 AM Report to [place]   Remember: Instructions that are not followed completely may result in serious medical risk,  up to and including death, or upon the discretion of your surgeon and anesthesiologist your  surgery may need to be rescheduled.     _X__ 1. Do not eat food after midnight the night before your procedure.                 No gum chewing or hard candies. You may drink clear liquids up to 2 hours                 before you are scheduled to arrive for your surgery- DO not drink clear                 liquids within 2 hours of the start of your surgery.                 Clear Liquids include:  water, apple juice without pulp, clear carbohydrate                 drink such as Clearfast of Gatorade, Black Coffee or Tea (Do not add                 anything to coffee or tea).  __X__2.  On the morning of surgery brush your teeth with toothpaste and water, you                may rinse your mouth with mouthwash if you wish.  Do not swallow any toothpaste of mouthwash.     _X__ 3.  No Alcohol for 24 hours before or after surgery.   _X__ 4.  Do Not Smoke or use e-cigarettes For 24 Hours Prior to Your Surgery.                 Do not use any chewable tobacco products for at least 6 hours prior to                 surgery.  ____  5.  Bring all medications with you on the day of surgery if instructed.   __X__  6.  Notify your doctor if there is any change in your medical condition      (cold, fever, infections).     Do not wear jewelry, make-up, hairpins, clips or nail polish. Do not wear lotions, powders, or perfumes. You may wear deodorant. Do not shave 48 hours prior to surgery. Men may shave face and neck. Do not bring valuables to the hospital.    [facility] is not responsible for any belongings or valuables.  Contacts, dentures or bridgework may not be worn into surgery. Leave your suitcase in the  car. After surgery it may be brought to your room. For patients admitted to the hospital, discharge time is determined by your treatment team.   Patients discharged the day of surgery will not be allowed to drive home.   Please read over the following fact sheets that you were given:   Surgical Site Infection Prevention          ____ Take these medicines the morning of surgery with A SIP OF WATER:    1. NONE  2.   3.   4.  5.  6.  ____ Fleet Enema (as directed)   __X__ Use CHG Soap as directed   DO NOT PUT ON BREASTS  ____ Use inhalers  on the day of surgery  ____ Stop metformin 2 days prior to surgery    ____ Take 1/2 of usual insulin dose the night before surgery. No insulin the morning          of surgery.   ____ Stop Coumadin/Plavix/aspirin on   ____ Stop Anti-inflammatories on    ____ Stop supplements until after surgery.    ____ Bring C-Pap to the hospital.

## 2019-06-08 NOTE — Progress Notes (Signed)
Patient ID: Lori Velasquez, female   DOB: May 25, 1991, 28 y.o.   MRN: [id number]  Reason for Consult: Pre-op Exam   Referred by No ref. provider found  Subjective:     HPI:  Lori PhoenixLori D Yorks is a 28 y.o. female. She is   Pregnancy #3 Problems (from 08/28/18 to present)    Problem Noted Resolved   Group B streptococcal bacteriuria 11/01/2018 by Conard NovakJackson, Stephen D, MD No   Supervision of high risk pregnancy, antepartum 10/25/2018 by Natale MilchSchuman, Jimya Ciani R, MD No   Overview Addendum 05/05/2019  3:38 PM by Nadara MustardHarris, Robert P, MD      [facility] Prenatal Labs  Dating  7 wk US Blood type: A POS  Genetic Screen  Declines Antibody:Negative (11/18 1455)  Anatomic US complete Rubella: 6.30 (11/18 1455) IMMUNE Varicella: Immune  GTT nml      RPR: Non Reactive (11/18 1455)   Rhogam  not needed HBsAg: Negative (11/18 1455)   TDaP vaccine     5/28                  HIV: Non Reactive (11/18 1455)   Flu Shot   10/25/18                             GBS:   Contraception  tubal ligation  [x]  sign BTL consents Pap: 2019 NIL  CBB  no   CS/VBAC  desires RLTCS   Baby Food     Support Person   FOB: Leotis ShamesJeffery Mother: Dorita FrayKimberly            Short interval between pregnancies affecting pregnancy, antepartum 10/25/2018 by Natale MilchSchuman, Shonique Pelphrey R, MD No   History of cesarean delivery, antepartum 10/25/2018 by Natale MilchSchuman, Deona Novitski R, MD No   Overview Signed 05/05/2019  3:40 PM by Nadara MustardHarris, Robert P, MD    Planned 06/14/19 (39 weeks)          Past Medical History:  Diagnosis Date  . Anemia   . Asthma   . Heart murmur    cardiac US done during last pregnancy   . Iron deficiency anemia 03/11/2018  . Short interval between pregnancies affecting pregnancy in first trimester, antepartum 08/07/2017  . Supervision of high risk pregnancy, antepartum 08/07/2017   [facility] Prenatal Labs Dating LMP Blood type: A/Positive/-- (08/31 1557)  Genetic Screen Declines Antibody:Negative (08/31 1557) Anatomic US  Rubella:   Varicella:   GTT Early:               Third trimester: 105 RPR: Non Reactive (08/31 1557)  Rhogam  not needed HBsAg: Negative (08/31 1557)  TDaP vaccine                       Flu Shot: HIV:   neg Baby Food    Breast/Bottle                      Family History  Problem Relation Age of Onset  . Anemia Mother   . Diabetes Maternal Grandfather    Past Surgical History:  Procedure Laterality Date  . CESAREAN SECTION    . CESAREAN SECTION N/A 04/01/2018   Procedure: CESAREAN SECTION;  Surgeon: Natale MilchSchuman, Rhiley Solem R, MD;  Location: [facility] ORS;  Service: Obstetrics;  Laterality: N/A;  . surgery for esophagela atresia as infant      Short Social History:  Social History   Tobacco  Use  . Smoking status: Never Smoker  . Smokeless tobacco: Never Used  Substance Use Topics  . Alcohol use: No    Allergies  Allergen Reactions  . Peppermint Oil   . Diphenhydramine Hcl     Told she had some reaction as a young child    Current Outpatient Medications  Medication Sig Dispense Refill  . Prenatal 27-1 MG TABS TAKE 1 TABLET BY MOUTH ONCE DAILY 90 each 11   No current facility-administered medications for this visit.     Review of Systems  Constitutional: Negative for chills, fatigue, fever and unexpected weight change.  HENT: Negative for trouble swallowing.  Eyes: Negative for loss of vision.  Respiratory: Negative for cough, shortness of breath and wheezing.  Cardiovascular: Negative for chest pain, leg swelling, palpitations and syncope.  GI: Negative for abdominal pain, blood in stool, diarrhea, nausea and vomiting.  GU: Negative for difficulty urinating, dysuria, frequency and hematuria.  Musculoskeletal: Negative for back pain, leg pain and joint pain.  Skin: Negative for rash.  Neurological: Negative for dizziness, headaches, light-headedness, numbness and seizures.  Psychiatric: Negative for behavioral problem, confusion, depressed mood and sleep disturbance.        Objective:   Objective   Vitals:   06/08/19 0910  BP: 122/68  Pulse: (!) 114  Weight: 198 lb (89.8 kg)  Height: 4\' 11"  (1.499 m)   Body mass index is 39.99 kg/m.  Physical Exam Vitals signs and nursing note reviewed.  Constitutional:      Appearance: She is well-developed.  HENT:     Head: Normocephalic and atraumatic.  Eyes:     Pupils: Pupils are equal, round, and reactive to light.  Cardiovascular:     Rate and Rhythm: Normal rate and regular rhythm.  Pulmonary:     Effort: Pulmonary effort is normal. No respiratory distress.  Abdominal:     General: There is no distension.     Palpations: Abdomen is soft.  Skin:    General: Skin is warm and dry.  Neurological:     Mental Status: She is alert and oriented to person, place, and time.  Psychiatric:        Behavior: Behavior normal.        Thought Content: Thought content normal.        Judgment: Judgment normal.   FHR: 140bpm      Assessment/Plan:     28 yo G3P2002 [redacted]w[redacted]d Hx of prior LTCS- plan for repeat cesarean next week. Discussed risk and benefits including blood loss, infection and damage to surrounding organs. Risk of cesarean hysterectomy discussed.  Desires sterilization by bilateral tubal ligation.  Consents signed.  Adrian Prows MD [facility], [facility] 06/08/2019 10:22 PM

## 2019-06-09 ENCOUNTER — Other Ambulatory Visit
Admission: RE | Admit: 2019-06-09 | Discharge: 2019-06-09 | Disposition: A | Discharge status: Home / Self Care | Payer: Medicaid Other | Source: Ambulatory Visit | Attending: Obstetrics and Gynecology | Admitting: Obstetrics and Gynecology

## 2019-06-09 ENCOUNTER — Other Ambulatory Visit: Payer: Self-pay

## 2019-06-09 DIAGNOSIS — Z01812 Encounter for preprocedural laboratory examination: Secondary | ICD-10-CM | POA: Insufficient documentation

## 2019-06-09 DIAGNOSIS — Z1159 Encounter for screening for other viral diseases: Secondary | ICD-10-CM | POA: Insufficient documentation

## 2019-06-09 LAB — SARS CORONAVIRUS 2 (TAT 6-24 HRS): SARS Coronavirus 2: NEGATIVE

## 2019-06-14 ENCOUNTER — Inpatient Hospital Stay: Payer: Medicaid Other | Admitting: Anesthesiology

## 2019-06-14 ENCOUNTER — Other Ambulatory Visit: Payer: Self-pay

## 2019-06-14 ENCOUNTER — Inpatient Hospital Stay
Admission: AD | Admit: 2019-06-14 | Discharge: 2019-06-16 | DRG: 785 | Disposition: A | Discharge status: Home / Self Care | Payer: Medicaid Other | Attending: Obstetrics and Gynecology | Admitting: Obstetrics and Gynecology

## 2019-06-14 ENCOUNTER — Encounter
Admission: AD | Disposition: A | Discharge status: Home / Self Care | Payer: Self-pay | Source: Home / Self Care | Attending: Obstetrics and Gynecology

## 2019-06-14 DIAGNOSIS — O99824 Streptococcus B carrier state complicating childbirth: Secondary | ICD-10-CM

## 2019-06-14 DIAGNOSIS — Z3A38 38 weeks gestation of pregnancy: Secondary | ICD-10-CM

## 2019-06-14 DIAGNOSIS — Z302 Encounter for sterilization: Secondary | ICD-10-CM

## 2019-06-14 DIAGNOSIS — O9902 Anemia complicating childbirth: Secondary | ICD-10-CM | POA: Diagnosis present

## 2019-06-14 DIAGNOSIS — D649 Anemia, unspecified: Secondary | ICD-10-CM | POA: Diagnosis present

## 2019-06-14 DIAGNOSIS — O34219 Maternal care for unspecified type scar from previous cesarean delivery: Secondary | ICD-10-CM | POA: Diagnosis present

## 2019-06-14 DIAGNOSIS — O34211 Maternal care for low transverse scar from previous cesarean delivery: Principal | ICD-10-CM | POA: Diagnosis present

## 2019-06-14 DIAGNOSIS — Z3A39 39 weeks gestation of pregnancy: Secondary | ICD-10-CM

## 2019-06-14 LAB — CBC
HCT: 40.7 % (ref 36.0–46.0)
HCT: 35.9 % — ABNORMAL LOW (ref 36.0–46.0)
Hemoglobin: 12.7 g/dL (ref 12.0–15.0)
Hemoglobin: 11.1 g/dL — ABNORMAL LOW (ref 12.0–15.0)
MCH: 25.7 pg — ABNORMAL LOW (ref 26.0–34.0)
MCH: 25.9 pg — ABNORMAL LOW (ref 26.0–34.0)
MCHC: 31.2 g/dL (ref 30.0–36.0)
MCHC: 30.9 g/dL (ref 30.0–36.0)
MCV: 82.4 fL (ref 80.0–100.0)
MCV: 83.7 fL (ref 80.0–100.0)
Platelets: 168 10*3/uL (ref 150–400)
Platelets: 163 10*3/uL (ref 150–400)
RBC: 4.94 MIL/uL (ref 3.87–5.11)
RBC: 4.29 MIL/uL (ref 3.87–5.11)
RDW: 15.5 % (ref 11.5–15.5)
RDW: 15.1 % (ref 11.5–15.5)
WBC: 14.6 10*3/uL — ABNORMAL HIGH (ref 4.0–10.5)
WBC: 16.7 10*3/uL — ABNORMAL HIGH (ref 4.0–10.5)
nRBC: 0 % (ref 0.0–0.2)
nRBC: 0 % (ref 0.0–0.2)

## 2019-06-14 LAB — TYPE AND SCREEN
ABO/RH(D): A POS
Antibody Screen: NEGATIVE

## 2019-06-14 SURGERY — Surgical Case
Anesthesia: Spinal

## 2019-06-14 MED ORDER — SIMETHICONE 80 MG PO CHEW
80.0000 mg | CHEWABLE_TABLET | Freq: Three times a day (TID) | ORAL | Status: DC
  Administered 2019-06-14 – 2019-06-16 (×6): 80 mg via ORAL
  Filled 2019-06-14 (×6): qty 1

## 2019-06-14 MED ORDER — OXYTOCIN 40 UNITS IN NORMAL SALINE INFUSION - SIMPLE MED
INTRAVENOUS | Status: AC
  Filled 2019-06-14: qty 1000

## 2019-06-14 MED ORDER — ONDANSETRON HCL 4 MG/2ML IJ SOLN
4.0000 mg | Freq: Once | INTRAMUSCULAR | Status: AC | PRN
  Administered 2019-06-14: 11:00:00 4 mg via INTRAVENOUS
  Filled 2019-06-14: qty 2

## 2019-06-14 MED ORDER — OXYCODONE HCL 5 MG PO TABS: 5.0000 mg | ORAL_TABLET | ORAL | Status: DC | PRN

## 2019-06-14 MED ORDER — MENTHOL 3 MG MT LOZG
1.0000 | LOZENGE | OROMUCOSAL | Status: DC | PRN
  Filled 2019-06-14: qty 9

## 2019-06-14 MED ORDER — ONDANSETRON HCL 4 MG/2ML IJ SOLN
INTRAMUSCULAR | Status: DC | PRN
  Administered 2019-06-14: 4 mg via INTRAVENOUS

## 2019-06-14 MED ORDER — METHYLERGONOVINE MALEATE 0.2 MG/ML IJ SOLN: 0.2000 mg | INTRAMUSCULAR | Status: DC | PRN

## 2019-06-14 MED ORDER — HEMOSTATIC AGENTS (NO CHARGE) OPTIME
TOPICAL | Status: DC | PRN
  Administered 2019-06-14 (×2): 1 via TOPICAL

## 2019-06-14 MED ORDER — BUPIVACAINE IN DEXTROSE 0.75-8.25 % IT SOLN
INTRATHECAL | Status: DC | PRN
  Administered 2019-06-14: 1.5 mL via INTRATHECAL

## 2019-06-14 MED ORDER — KETOROLAC TROMETHAMINE 15 MG/ML IJ SOLN
15.0000 mg | Freq: Three times a day (TID) | INTRAMUSCULAR | Status: DC | PRN
  Administered 2019-06-14: 15 mg via INTRAVENOUS
  Filled 2019-06-14 (×3): qty 1

## 2019-06-14 MED ORDER — METHYLERGONOVINE MALEATE 0.2 MG PO TABS: 0.2000 mg | ORAL_TABLET | ORAL | Status: DC | PRN

## 2019-06-14 MED ORDER — TETRACAINE HCL 1 % IJ SOLN: INTRAMUSCULAR | Status: DC | PRN

## 2019-06-14 MED ORDER — MORPHINE SULFATE (PF) 0.5 MG/ML IJ SOLN
INTRAMUSCULAR | Status: AC
  Filled 2019-06-14: qty 10

## 2019-06-14 MED ORDER — FAMOTIDINE 20 MG PO TABS
20.0000 mg | ORAL_TABLET | Freq: Once | ORAL | Status: AC
  Administered 2019-06-14: 20 mg via ORAL
  Filled 2019-06-14: qty 1

## 2019-06-14 MED ORDER — EVICEL 5 ML EX KIT
PACK | CUTANEOUS | Status: DC | PRN
  Administered 2019-06-14: 1 via TOPICAL

## 2019-06-14 MED ORDER — MORPHINE SULFATE (PF) 2 MG/ML IV SOLN: 1.0000 mg | INTRAVENOUS | Status: DC | PRN

## 2019-06-14 MED ORDER — SENNOSIDES-DOCUSATE SODIUM 8.6-50 MG PO TABS
2.0000 | ORAL_TABLET | ORAL | Status: DC
  Administered 2019-06-14 – 2019-06-15 (×2): 2 via ORAL
  Filled 2019-06-14 (×2): qty 2

## 2019-06-14 MED ORDER — KETOROLAC TROMETHAMINE 30 MG/ML IJ SOLN
INTRAMUSCULAR | Status: DC | PRN
  Administered 2019-06-14: 30 mg via INTRAVENOUS

## 2019-06-14 MED ORDER — COCONUT OIL OIL: 1.0000 "application " | TOPICAL_OIL | Status: DC | PRN

## 2019-06-14 MED ORDER — CEFAZOLIN SODIUM-DEXTROSE 2-4 GM/100ML-% IV SOLN
2.0000 g | INTRAVENOUS | Status: AC
  Administered 2019-06-14: 2 g via INTRAVENOUS
  Filled 2019-06-14: qty 100

## 2019-06-14 MED ORDER — LACTATED RINGERS IV SOLN
INTRAVENOUS | Status: DC
  Administered 2019-06-14: 08:00:00 via INTRAVENOUS

## 2019-06-14 MED ORDER — LACTATED RINGERS IV BOLUS
1000.0000 mL | Freq: Once | INTRAVENOUS | Status: AC
  Administered 2019-06-14: 13:00:00 1000 mL via INTRAVENOUS

## 2019-06-14 MED ORDER — ACETAMINOPHEN 500 MG PO TABS
1000.0000 mg | ORAL_TABLET | Freq: Four times a day (QID) | ORAL | Status: DC
  Administered 2019-06-15 – 2019-06-16 (×5): 1000 mg via ORAL
  Filled 2019-06-14 (×6): qty 2

## 2019-06-14 MED ORDER — PROPOFOL 10 MG/ML IV BOLUS
INTRAVENOUS | Status: AC
  Filled 2019-06-14: qty 20

## 2019-06-14 MED ORDER — WITCH HAZEL-GLYCERIN EX PADS: 1.0000 "application " | MEDICATED_PAD | CUTANEOUS | Status: DC | PRN

## 2019-06-14 MED ORDER — SOD CITRATE-CITRIC ACID 500-334 MG/5ML PO SOLN
30.0000 mL | ORAL | Status: AC
  Administered 2019-06-14: 30 mL via ORAL
  Filled 2019-06-14: qty 30

## 2019-06-14 MED ORDER — ACETAMINOPHEN 500 MG PO TABS
1000.0000 mg | ORAL_TABLET | Freq: Four times a day (QID) | ORAL | Status: DC
  Administered 2019-06-14 (×2): 1000 mg via ORAL
  Filled 2019-06-14 (×2): qty 2

## 2019-06-14 MED ORDER — LACTATED RINGERS IV SOLN
INTRAVENOUS | Status: DC
  Administered 2019-06-14 – 2019-06-15 (×2): via INTRAVENOUS

## 2019-06-14 MED ORDER — PRENATAL MULTIVITAMIN CH
1.0000 | ORAL_TABLET | Freq: Every day | ORAL | Status: DC
  Administered 2019-06-15 – 2019-06-16 (×2): 1 via ORAL
  Filled 2019-06-14 (×2): qty 1

## 2019-06-14 MED ORDER — SODIUM CHLORIDE 0.9 % IV SOLN
INTRAVENOUS | Status: DC | PRN
  Administered 2019-06-14: 50 ug/min via INTRAVENOUS

## 2019-06-14 MED ORDER — BISACODYL 10 MG RE SUPP: 10.0000 mg | Freq: Every day | RECTAL | Status: DC | PRN

## 2019-06-14 MED ORDER — NALBUPHINE HCL 10 MG/ML IJ SOLN: 2.5000 mg | Freq: Four times a day (QID) | INTRAMUSCULAR | Status: DC | PRN

## 2019-06-14 MED ORDER — METHYLERGONOVINE MALEATE 0.2 MG/ML IJ SOLN
INTRAMUSCULAR | Status: AC
  Filled 2019-06-14: qty 1

## 2019-06-14 MED ORDER — SIMETHICONE 80 MG PO CHEW
80.0000 mg | CHEWABLE_TABLET | ORAL | Status: DC
  Administered 2019-06-14 – 2019-06-15 (×2): 80 mg via ORAL
  Filled 2019-06-14 (×2): qty 1

## 2019-06-14 MED ORDER — FERROUS SULFATE 325 (65 FE) MG PO TABS
325.0000 mg | ORAL_TABLET | Freq: Two times a day (BID) | ORAL | Status: DC
  Administered 2019-06-14 – 2019-06-16 (×4): 325 mg via ORAL
  Filled 2019-06-14 (×4): qty 1

## 2019-06-14 MED ORDER — FENTANYL CITRATE (PF) 100 MCG/2ML IJ SOLN: 25.0000 ug | INTRAMUSCULAR | Status: DC | PRN

## 2019-06-14 MED ORDER — BUPIVACAINE HCL 0.5 % IJ SOLN: INTRAMUSCULAR | Status: DC | PRN

## 2019-06-14 MED ORDER — SIMETHICONE 80 MG PO CHEW: 80.0000 mg | CHEWABLE_TABLET | ORAL | Status: DC | PRN

## 2019-06-14 MED ORDER — ZOLPIDEM TARTRATE 5 MG PO TABS: 5.0000 mg | ORAL_TABLET | Freq: Every evening | ORAL | Status: DC | PRN

## 2019-06-14 MED ORDER — DIBUCAINE (PERIANAL) 1 % EX OINT: 1.0000 "application " | TOPICAL_OINTMENT | CUTANEOUS | Status: DC | PRN

## 2019-06-14 MED ORDER — MORPHINE SULFATE (PF) 0.5 MG/ML IJ SOLN
INTRAMUSCULAR | Status: DC | PRN
  Administered 2019-06-14: .1 mg via EPIDURAL

## 2019-06-14 MED ORDER — IBUPROFEN 800 MG PO TABS
800.0000 mg | ORAL_TABLET | Freq: Three times a day (TID) | ORAL | Status: DC
  Administered 2019-06-15: 800 mg via ORAL
  Filled 2019-06-14: qty 1

## 2019-06-14 MED ORDER — DIPHENHYDRAMINE HCL 25 MG PO CAPS: 25.0000 mg | ORAL_CAPSULE | Freq: Four times a day (QID) | ORAL | Status: DC | PRN

## 2019-06-14 MED ORDER — HYDROMORPHONE HCL 1 MG/ML IJ SOLN: 0.2000 mg | INTRAMUSCULAR | Status: DC | PRN

## 2019-06-14 MED ORDER — FLEET ENEMA 7-19 GM/118ML RE ENEM: 1.0000 | ENEMA | Freq: Every day | RECTAL | Status: DC | PRN

## 2019-06-14 MED ORDER — TETRACAINE HCL 1 % IJ SOLN
INTRAMUSCULAR | Status: DC | PRN
  Administered 2019-06-14: 1 mg via INTRASPINAL

## 2019-06-14 MED ORDER — PHENYLEPHRINE 40 MCG/ML (10ML) SYRINGE FOR IV PUSH (FOR BLOOD PRESSURE SUPPORT)
PREFILLED_SYRINGE | INTRAVENOUS | Status: DC | PRN
  Administered 2019-06-14: 100 ug via INTRAVENOUS

## 2019-06-14 MED ORDER — BUPIVACAINE HCL (PF) 0.5 % IJ SOLN
INTRAMUSCULAR | Status: AC
  Filled 2019-06-14: qty 30

## 2019-06-14 MED ORDER — CARBOPROST TROMETHAMINE 250 MCG/ML IM SOLN
INTRAMUSCULAR | Status: AC
  Filled 2019-06-14: qty 1

## 2019-06-14 MED ORDER — FENTANYL CITRATE (PF) 100 MCG/2ML IJ SOLN
INTRAMUSCULAR | Status: DC | PRN
  Administered 2019-06-14: 15 ug via INTRAVENOUS

## 2019-06-14 MED ORDER — OXYTOCIN 40 UNITS IN NORMAL SALINE INFUSION - SIMPLE MED
2.5000 [IU]/h | INTRAVENOUS | Status: DC
  Administered 2019-06-14 (×2): 2.5 [IU]/h via INTRAVENOUS

## 2019-06-14 MED ORDER — BUPIVACAINE 0.25 % ON-Q PUMP DUAL CATH 400 ML
400.0000 mL | INJECTION | Status: DC
  Filled 2019-06-14: qty 400

## 2019-06-14 MED ORDER — EPHEDRINE SULFATE-NACL 50-0.9 MG/10ML-% IV SOSY
PREFILLED_SYRINGE | INTRAVENOUS | Status: DC | PRN
  Administered 2019-06-14: 10 mg via INTRAVENOUS

## 2019-06-14 MED ORDER — OXYTOCIN 40 UNITS IN NORMAL SALINE INFUSION - SIMPLE MED
INTRAVENOUS | Status: DC | PRN
  Administered 2019-06-14: 1000 mL via INTRAVENOUS

## 2019-06-14 MED ORDER — PROMETHAZINE HCL 25 MG/ML IJ SOLN
6.2500 mg | Freq: Once | INTRAMUSCULAR | Status: AC
  Administered 2019-06-14: 12:00:00 6.25 mg via INTRAVENOUS
  Filled 2019-06-14: qty 1

## 2019-06-14 MED ORDER — FENTANYL CITRATE (PF) 100 MCG/2ML IJ SOLN
INTRAMUSCULAR | Status: AC
  Filled 2019-06-14: qty 2

## 2019-06-14 MED ORDER — EVICEL 5 ML EX KIT
PACK | CUTANEOUS | Status: AC
  Filled 2019-06-14: qty 1

## 2019-06-14 MED ORDER — DEXAMETHASONE SODIUM PHOSPHATE 10 MG/ML IJ SOLN
INTRAMUSCULAR | Status: DC | PRN
  Administered 2019-06-14: 4 mg via INTRAVENOUS

## 2019-06-14 SURGICAL SUPPLY — 34 items
CANISTER SUCT 3000ML PPV (MISCELLANEOUS) ×2 IMPLANT
CATH KIT ON-Q SILVERSOAK 5 (CATHETERS) IMPLANT
CATH KIT ON-Q SILVERSOAK 5IN (CATHETERS) IMPLANT
CHLORAPREP W/TINT 26 (MISCELLANEOUS) ×4 IMPLANT
DERMABOND ADVANCED (GAUZE/BANDAGES/DRESSINGS) ×1
DERMABOND ADVANCED .7 DNX12 (GAUZE/BANDAGES/DRESSINGS) ×1 IMPLANT
DRESSING SURGICEL FIBRLLR 1X2 (HEMOSTASIS) IMPLANT
DRSG OPSITE POSTOP 4X10 (GAUZE/BANDAGES/DRESSINGS) ×2 IMPLANT
DRSG SURGICEL FIBRILLAR 1X2 (HEMOSTASIS) ×6
ELECT CAUTERY BLADE 6.4 (BLADE) ×2 IMPLANT
ELECT REM PT RETURN 9FT ADLT (ELECTROSURGICAL) ×2
ELECTRODE REM PT RTRN 9FT ADLT (ELECTROSURGICAL) ×1 IMPLANT
EXTRACTOR VACUUM KIWI (MISCELLANEOUS) ×1 IMPLANT
GLOVE BIOGEL PI IND STRL 6.5 (GLOVE) ×2 IMPLANT
GLOVE BIOGEL PI INDICATOR 6.5 (GLOVE) ×2
GOWN STRL REUS W/ TWL LRG LVL3 (GOWN DISPOSABLE) ×1 IMPLANT
GOWN STRL REUS W/ TWL XL LVL3 (GOWN DISPOSABLE) ×2 IMPLANT
GOWN STRL REUS W/TWL LRG LVL3 (GOWN DISPOSABLE) ×1
GOWN STRL REUS W/TWL XL LVL3 (GOWN DISPOSABLE) ×2
HANDLE YANKAUER SUCT BULB TIP (MISCELLANEOUS) ×1 IMPLANT
NS IRRIG 1000ML POUR BTL (IV SOLUTION) ×2 IMPLANT
PACK C SECTION AR (MISCELLANEOUS) ×2 IMPLANT
PAD OB MATERNITY 4.3X12.25 (PERSONAL CARE ITEMS) ×2 IMPLANT
PAD PREP 24X41 OB/GYN DISP (PERSONAL CARE ITEMS) ×2 IMPLANT
PENCIL SMOKE ULTRAEVAC 22 CON (MISCELLANEOUS) ×2 IMPLANT
RETRACTOR TRAXI PANNICULUS (MISCELLANEOUS) IMPLANT
SPONGE LAP 18X18 RF (DISPOSABLE) ×1 IMPLANT
SUT CHROMIC 0 CT 1 (SUTURE) ×3 IMPLANT
SUT MNCRL AB 4-0 PS2 18 (SUTURE) ×2 IMPLANT
SUT PLAIN 3-0 (SUTURE) ×2 IMPLANT
SUT VIC AB 0 CT1 36 (SUTURE) ×7 IMPLANT
SUT VIC AB 2-0 CT1 36 (SUTURE) ×2 IMPLANT
SYR 30ML LL (SYRINGE) ×4 IMPLANT
TRAXI PANNICULUS RETRACTOR (MISCELLANEOUS) ×1

## 2019-06-14 NOTE — Anesthesia Post-op Follow-up Note (Signed)
Anesthesia QCDR form completed.        

## 2019-06-14 NOTE — Op Note (Signed)
Cesarean Section Procedure Note 06/14/19  Pre-operative Diagnosis:  1. History of prior low transverse cesarean section x 2  2. [redacted] week gestation  3. Desires Sterilization  Post-operative Diagnosis: same, delivered. Procedure: Repeal Low Transverse Cesarean Section and bilateral tubal ligation Surgeon: Adelene Idlerhristanna Mora Pedraza MD   Assistant(s): Jule Economyolleen Guitterrez CNM - No other skilled surgical assistant available. Anesthesia: Spinal Estimated Blood Loss: 1500 cc Complications: None; patient tolerated the procedure well.   Disposition: PACU - hemodynamically stable. Condition: stable   Findings: A female infant in the cephalic presentation. Amniotic fluid - clear   Birth weight: 7 lbs 6oz Apgars of 8 and 9.  Intact placenta with a three-vessel cord. Grossly normal uterus, tubes and ovaries bilaterally. No intraabdominal adhesions were noted.   Procedure Details    The patient was taken to operating room, identified as the correct patient and the procedure verified as C-Section Delivery. A time out was held and the above information confirmed. After induction of anesthesia, the patient was draped and prepped in the usual sterile manner. A Pfannenstiel incision was made and carried down through the subcutaneous tissue to the fascia. Fascial incision was made and extended transversely with the Mayo scissors. The fascia was separated from the underlying rectus tissue superiorly and inferiorly. The peritoneum was identified and entered bluntly. Peritoneal incision was extended longitudinally. A low transverse hysterotomy was made. The fetus was delivered atraumatically with assistance of the flat kiwi vaccum. The umbilical cord was clamped x2 and cut and the infant was handed to the awaiting pediatricians. The placenta was removed intact and appeared normal with a 3-vessel cord marginal cord insertion. The uterus was exteriorized and cleared of all clot and debris. The hysterotomy was closed with  running sutures of 0 Vicryl suture. A second imbricating layer was placed with the same suture. There was slow bleeding observed from several locations along the suture line. Pressure was applied to the uterine incision with a laparotomy sponge. Attention was turned to the fallopian tubes.   Desire for tubal ligation was again verbally confirmed with the patient. The right fallopian tube was followed from the cornua to the fimbria. The babcock clamp was used to grasp and elevate an avascular midsection of the tube approximately 3-4cm from the cornua. 0-chromic suture was used passed through the mesosalpinx and used to create a knuckle to fallopian tube. The tube was double ligated with 0- chromic suture and the intervening portion of tube was transected and removed with the Metzenbaum scissors. Excellent hemostasis was noted. Attention was then turned to the left fallopian tube after confirmation of identification by tracing the tube out to the fimbriae. The same procedure was then performed on the left fallopian tube. Again, excellent hemostasis was noted at the end of the procedure at both sites of the tubal ligation.   Attention was returned to the uterine incision. There was a persistent ooze. Multiple figure of eight sutures were placed to control bleeding. The bleeding improved but a slow persistent ooze was present. Evicel were applied to the uterine incision line.  Excellent hemostasis was then observed. The uterus was returned to the abdomen. The uterine incision was again inspected and Evicel and Fibrillar were applied.  Excellent hemostasis was noted. The peritoneum was closed with a running stitch of 2-0 Vicryl. The rectus muscles were hemostatic. The rectus fascia was then reapproximated with running sutures of 0-vicryl. Subcutaneous tissues are then irrigated with saline and hemostasis assured with the bovie. The subcutaneous fat was approximated with 3-0  plain and a running stitch.  The skin was  closed with 4-0 monocryl suture in a subcuticular fashion followed by skin adhesive.  Instrument, sponge, and needle counts were correct prior to the abdominal closure and at the conclusion of the case.  The patient tolerated the procedure well and was transferred to the recovery room in stable condition.   Homero Fellers MD [facility], [facility] 06/14/19 9:57 AM

## 2019-06-14 NOTE — Plan of Care (Signed)
Problem: Education: Goal: Knowledge of General Education information will improve Description: Including pain rating scale, medication(s)/side effects and non-pharmacologic comfort measures 06/14/2019 1112 by Sharalyn Ink, RN Outcome: Progressing 06/14/2019 1111 by Sharalyn Ink, RN Outcome: Progressing   Problem: Health Behavior/Discharge Planning: Goal: Ability to manage health-related needs will improve 06/14/2019 1112 by Sharalyn Ink, RN Outcome: Progressing 06/14/2019 1111 by Sharalyn Ink, RN Outcome: Progressing   Problem: Clinical Measurements: Goal: Ability to maintain clinical measurements within normal limits will improve 06/14/2019 1112 by Sharalyn Ink, RN Outcome: Progressing 06/14/2019 1111 by Sharalyn Ink, RN Outcome: Progressing Goal: Will remain free from infection 06/14/2019 1112 by Sharalyn Ink, RN Outcome: Progressing 06/14/2019 1111 by Sharalyn Ink, RN Outcome: Progressing Goal: Diagnostic test results will improve 06/14/2019 1112 by Sharalyn Ink, RN Outcome: Progressing 06/14/2019 1111 by Sharalyn Ink, RN Outcome: Progressing Goal: Respiratory complications will improve 06/14/2019 1112 by Sharalyn Ink, RN Outcome: Progressing 06/14/2019 1111 by Sharalyn Ink, RN Outcome: Progressing Goal: Cardiovascular complication will be avoided 06/14/2019 1112 by Sharalyn Ink, RN Outcome: Progressing 06/14/2019 1111 by Sharalyn Ink, RN Outcome: Progressing   Problem: Activity: Goal: Risk for activity intolerance will decrease 06/14/2019 1112 by Sharalyn Ink, RN Outcome: Progressing 06/14/2019 1111 by Sharalyn Ink, RN Outcome: Progressing   Problem: Nutrition: Goal: Adequate nutrition will be maintained 06/14/2019 1112 by Sharalyn Ink, RN Outcome: Progressing 06/14/2019 1111 by Sharalyn Ink, RN Outcome: Progressing   Problem: Coping: Goal: Level of anxiety will  decrease 06/14/2019 1112 by Sharalyn Ink, RN Outcome: Progressing 06/14/2019 1111 by Sharalyn Ink, RN Outcome: Progressing   Problem: Elimination: Goal: Will not experience complications related to bowel motility 06/14/2019 1112 by Sharalyn Ink, RN Outcome: Progressing 06/14/2019 1111 by Sharalyn Ink, RN Outcome: Progressing Goal: Will not experience complications related to urinary retention 06/14/2019 1112 by Sharalyn Ink, RN Outcome: Progressing 06/14/2019 1111 by Sharalyn Ink, RN Outcome: Progressing   Problem: Pain Managment: Goal: General experience of comfort will improve 06/14/2019 1112 by Sharalyn Ink, RN Outcome: Progressing 06/14/2019 1111 by Sharalyn Ink, RN Outcome: Progressing   Problem: Safety: Goal: Ability to remain free from injury will improve 06/14/2019 1112 by Sharalyn Ink, RN Outcome: Progressing 06/14/2019 1111 by Sharalyn Ink, RN Outcome: Progressing   Problem: Skin Integrity: Goal: Risk for impaired skin integrity will decrease 06/14/2019 1112 by Sharalyn Ink, RN Outcome: Progressing 06/14/2019 1111 by Sharalyn Ink, RN Outcome: Progressing   Problem: Education: Goal: Knowledge of condition will improve 06/14/2019 1112 by Sharalyn Ink, RN Outcome: Progressing 06/14/2019 1111 by Sharalyn Ink, RN Outcome: Progressing Goal: Individualized Educational Video(s) 06/14/2019 1112 by Sharalyn Ink, RN Outcome: Progressing 06/14/2019 1111 by Sharalyn Ink, RN Outcome: Progressing Goal: Individualized Newborn Educational Video(s) 06/14/2019 1112 by Sharalyn Ink, RN Outcome: Progressing 06/14/2019 1111 by Sharalyn Ink, RN Outcome: Progressing   Problem: Activity: Goal: Will verbalize the importance of balancing activity with adequate rest periods 06/14/2019 1112 by Sharalyn Ink, RN Outcome: Progressing 06/14/2019 1111 by Sharalyn Ink, RN Outcome:  Progressing Goal: Ability to tolerate increased activity will improve 06/14/2019 1112 by Sharalyn Ink, RN Outcome: Progressing 06/14/2019 1111 by Sharalyn Ink, RN Outcome: Progressing   Problem: Coping: Goal: Ability to identify and utilize available resources and services will improve 06/14/2019 1112 by Sharalyn Ink, RN Outcome: Progressing 06/14/2019 1111 by Katharina Caper  K, RN Outcome: Progressing   Problem: Life Cycle: Goal: Chance of risk for complications during the postpartum period will decrease 06/14/2019 1112 by Randell PatientLunsford, Annalis Kaczmarczyk K, RN Outcome: Progressing 06/14/2019 1111 by Randell PatientLunsford, Christopher Glasscock K, RN Outcome: Progressing   Problem: Role Relationship: Goal: Ability to demonstrate positive interaction with newborn will improve 06/14/2019 1112 by Randell PatientLunsford, Mckennah Kretchmer K, RN Outcome: Progressing 06/14/2019 1111 by Randell PatientLunsford, Javonne Dorko K, RN Outcome: Progressing   Problem: Skin Integrity: Goal: Demonstration of wound healing without infection will improve 06/14/2019 1112 by Randell PatientLunsford, Kierria Feigenbaum K, RN Outcome: Progressing 06/14/2019 1111 by Randell PatientLunsford, Nyoka Alcoser K, RN Outcome: Progressing

## 2019-06-14 NOTE — Interval H&P Note (Signed)
History and Physical Interval Note:  06/14/2019 7:27 AM  Lori Velasquez  has presented today for surgery, with the diagnosis of REPEAT CESAREAN [name].  The various methods of treatment have been discussed with the patient and family. After consideration of risks, benefits and other options for treatment, the patient has consented to  Procedure(s): [place] (N/A) as a surgical intervention.  The patient's history has been reviewed, patient examined, no change in status, stable for surgery.  I have reviewed the patient's chart and labs.  Questions were answered to the patient's satisfaction.     [place]

## 2019-06-14 NOTE — Anesthesia Procedure Notes (Signed)
Spinal  Patient location during procedure: OR Start time: 06/14/2019 7:55 AM End time: 06/14/2019 8:02 AM Staffing Resident/CRNA: Justus Memory, CRNA Performed: resident/CRNA  Preanesthetic Checklist Completed: patient identified, site marked, surgical consent, pre-op evaluation, timeout performed, IV checked, risks and benefits discussed and monitors and equipment checked Spinal Block Patient position: sitting Prep: Betadine Patient monitoring: heart rate, continuous pulse ox, blood pressure and cardiac monitor Approach: midline Location: L3-4 Injection technique: single-shot Needle Needle type: Whitacre and Introducer  Needle gauge: 24 G Needle length: 9 cm Additional Notes Negative paresthesia. Negative blood return. Positive free-flowing CSF. Expiration date of kit checked and confirmed. Patient tolerated procedure well, without complications.

## 2019-06-14 NOTE — Progress Notes (Signed)
Evaluated patient for low urine output. She is alert and feeling well. Pain controlled in bed.  Abdomen is soft, non tender, no peritoneal signs. Incision is clean dry and intact.  Would suspect that her low urine output and other vital sign abnormalities are related to her significant EBL. She appears stable and abdomen is benign, not suspicious at this time for continued bleeding. Will check a CBC and continue to monitor closely. Will bolus with 1000 cc LR.    Adrian Prows MD [facility], [facility] 06/14/2019 1:02 PM

## 2019-06-14 NOTE — Plan of Care (Signed)

## 2019-06-14 NOTE — Anesthesia Preprocedure Evaluation (Addendum)
Anesthesia Evaluation  Patient identified by MRN, date of birth, ID band Patient awake    Reviewed: Allergy & Precautions, NPO status , Patient's Chart, lab work & pertinent test results  History of Anesthesia Complications Negative for: history of anesthetic complications  Airway Mallampati: III  TM Distance: >3 FB Neck ROM: Full    Dental no notable dental hx.    Pulmonary asthma , neg sleep apnea, neg COPD,    breath sounds clear to auscultation- rhonchi (-) wheezing      Cardiovascular Exercise Tolerance: Good (-) hypertension(-) CAD and (-) Past MI + Valvular Problems/Murmurs  Rhythm:Regular Rate:Normal - Systolic murmurs and - Diastolic murmurs    Neuro/Psych  Headaches, negative psych ROS   GI/Hepatic negative GI ROS, Neg liver ROS,   Endo/Other  negative endocrine ROSneg diabetes  Renal/GU negative Renal ROS     Musculoskeletal negative musculoskeletal ROS (+)   Abdominal (+) + obese, Gravid abdomen  Peds  Hematology  (+) anemia ,   Anesthesia Other Findings Past Medical History: No date: Anemia No date: Asthma No date: Heart murmur     Comment:  cardiac US done during last pregnancy  03/11/2018: Iron deficiency anemia   Reproductive/Obstetrics (+) Pregnancy                             Lab Results  Component Value Date   WBC 14.6 (H) 06/14/2019   HGB 12.7 06/14/2019   HCT 40.7 06/14/2019   MCV 82.4 06/14/2019   PLT 168 06/14/2019    Anesthesia Physical  Anesthesia Plan  ASA: II  Anesthesia Plan: Spinal   Post-op Pain Management:    Induction:   PONV Risk Score and Plan: 2 and Ondansetron  Airway Management Planned: Natural Airway and Nasal Cannula  Additional Equipment:   Intra-op Plan:   Post-operative Plan:   Informed Consent: I have reviewed the patients History and Physical, chart, labs and discussed the procedure including the risks, benefits and  alternatives for the proposed anesthesia with the patient or authorized representative who has indicated his/her understanding and acceptance.     Dental advisory given  Plan Discussed with: CRNA and Anesthesiologist  Anesthesia Plan Comments:         Anesthesia Quick Evaluation

## 2019-06-14 NOTE — Transfer of Care (Signed)
Immediate Anesthesia Transfer of Care Note  Patient: Lori Velasquez  Procedure(s) Performed: CESAREAN SECTION WITH BILATERAL TUBAL LIGATION (N/A )  Patient Location: PACU  Anesthesia Type:SPINAL  Level of Consciousness: awake  Airway & Oxygen Therapy: Patient Spontanous Breathing  Post-op Assessment: Report given to RN and Post -op Vital signs reviewed and stable  Post vital signs: Reviewed and stable  Last Vitals:  Vitals Value Taken Time  BP    Temp    Pulse    Resp    SpO2      Last Pain:  Vitals:   06/14/19 0720  TempSrc: Oral  PainSc: 0-No pain         Complications: No apparent anesthesia complications

## 2019-06-15 ENCOUNTER — Encounter: Payer: Self-pay | Admitting: Obstetrics and Gynecology

## 2019-06-15 LAB — CBC
HCT: 30.6 % — ABNORMAL LOW (ref 36.0–46.0)
HCT: 29.7 % — ABNORMAL LOW (ref 36.0–46.0)
Hemoglobin: 9.4 g/dL — ABNORMAL LOW (ref 12.0–15.0)
Hemoglobin: 9.3 g/dL — ABNORMAL LOW (ref 12.0–15.0)
MCH: 25.8 pg — ABNORMAL LOW (ref 26.0–34.0)
MCH: 26.1 pg (ref 26.0–34.0)
MCHC: 30.7 g/dL (ref 30.0–36.0)
MCHC: 31.3 g/dL (ref 30.0–36.0)
MCV: 83.8 fL (ref 80.0–100.0)
MCV: 83.2 fL (ref 80.0–100.0)
Platelets: 145 10*3/uL — ABNORMAL LOW (ref 150–400)
Platelets: 143 10*3/uL — ABNORMAL LOW (ref 150–400)
RBC: 3.65 MIL/uL — ABNORMAL LOW (ref 3.87–5.11)
RBC: 3.57 MIL/uL — ABNORMAL LOW (ref 3.87–5.11)
RDW: 15 % (ref 11.5–15.5)
RDW: 15.1 % (ref 11.5–15.5)
WBC: 11.7 10*3/uL — ABNORMAL HIGH (ref 4.0–10.5)
WBC: 10.7 10*3/uL — ABNORMAL HIGH (ref 4.0–10.5)
nRBC: 0 % (ref 0.0–0.2)
nRBC: 0 % (ref 0.0–0.2)

## 2019-06-15 LAB — SURGICAL PATHOLOGY

## 2019-06-15 MED ORDER — KETOROLAC TROMETHAMINE 30 MG/ML IJ SOLN
15.0000 mg | Freq: Three times a day (TID) | INTRAMUSCULAR | Status: DC | PRN
  Administered 2019-06-15: 15 mg via INTRAVENOUS
  Filled 2019-06-15: qty 1

## 2019-06-15 MED ORDER — IBUPROFEN 800 MG PO TABS
800.0000 mg | ORAL_TABLET | Freq: Three times a day (TID) | ORAL | Status: DC
  Administered 2019-06-16 (×2): 800 mg via ORAL
  Filled 2019-06-15 (×2): qty 1

## 2019-06-15 NOTE — Progress Notes (Signed)
Admit Date: 06/14/2019 Today's Date: 06/15/2019  Subjective: Postpartum Day 1: Cesarean Delivery Patient reports tolerating PO and + flatus.    Objective: Vital signs in last 24 hours: Temp:  [97.5 F (36.4 C)-98.6 F (37 C)] 98.6 F (37 C) (07/08 0753) Pulse Rate:  [89-141] 110 (07/08 0753) Resp:  [3-41] 18 (07/08 0753) BP: (88-130)/(33-113) 99/50 (07/08 0753) SpO2:  [94 %-100 %] 99 % (07/08 0753)  Physical Exam:  General: alert and cooperative Lochia: appropriate Uterine Fundus: firm Incision: healing well DVT Evaluation: No evidence of DVT seen on physical exam.  Recent Labs    06/14/19 1326 06/15/19 0554  HGB 11.1* 9.4*  HCT 35.9* 30.6*    Assessment/Plan: Status post Cesarean section. Doing well postoperatively.  Continue current care. CBC at [place] 06/15/2019, 8:08 AM

## 2019-06-15 NOTE — Anesthesia Postprocedure Evaluation (Signed)
Anesthesia Post Note  Patient: Lori Velasquez  Procedure(s) Performed: CESAREAN SECTION WITH BILATERAL TUBAL LIGATION (N/A )  Patient location during evaluation: Mother Baby Anesthesia Type: Spinal Level of consciousness: awake and alert and oriented Pain management: satisfactory to patient Vital Signs Assessment: post-procedure vital signs reviewed and stable Respiratory status: respiratory function stable Cardiovascular status: stable Postop Assessment: no backache, no headache, spinal receding, no apparent nausea or vomiting, patient able to bend at knees, able to ambulate and adequate PO intake Anesthetic complications: no     Last Vitals:  Vitals:   06/15/19 0455 06/15/19 0500  BP: (!) 102/59   Pulse: 93 (!) 101  Resp: 20   Temp: 36.8 C   SpO2: 97% 98%    Last Pain:  Vitals:   06/15/19 0455  TempSrc: Oral  PainSc:                  Blima Singer

## 2019-06-15 NOTE — Anesthesia Post-op Follow-up Note (Signed)
  Anesthesia Pain Follow-up Note  Patient: Lori Velasquez  Day #: 1  Date of Follow-up: 06/15/2019 Time: 7:17 AM  Last Vitals:  Vitals:   06/15/19 0455 06/15/19 0500  BP: (!) 102/59   Pulse: 93 (!) 101  Resp: 20   Temp: 36.8 C   SpO2: 97% 98%    Level of Consciousness: alert  Pain: mild   Side Effects:None  Catheter Site Exam:clean, dry     Plan: D/C from anesthesia care at surgeon's request  Blima Singer

## 2019-06-15 NOTE — Plan of Care (Signed)
Vs stable but HR does sometimes run in the low 100s; pt was able to stand at the bedside with RN for several minutes without feeling lightheaded or dizzy and urine output and color improved so foley was able to be removed at 0500; taking tylenol and toradol for pain control; tolerating regular diet; will be up with assist

## 2019-06-16 MED ORDER — OXYCODONE-ACETAMINOPHEN 5-325 MG PO TABS: 1.0000 | ORAL_TABLET | Freq: Four times a day (QID) | ORAL | 0 refills | Status: DC | PRN

## 2019-06-16 NOTE — Discharge Instructions (Signed)
Postpartum Care After Cesarean Delivery °This sheet gives you information about how to care for yourself from the time you deliver your baby to up to 6-12 weeks after delivery (postpartum period). Your health care provider may also give you more specific instructions. If you have problems or questions, contact your health care provider. °Follow these instructions at home: °Medicines °· Take over-the-counter and prescription medicines only as told by your health care provider. °· If you were prescribed an antibiotic medicine, take it as told by your health care provider. Do not stop taking the antibiotic even if you start to feel better. °· Ask your health care provider if the medicine prescribed to you: °? Requires you to avoid driving or using heavy machinery. °? Can cause constipation. You may need to take actions to prevent or treat constipation, such as: °§ Drink enough fluid to keep your urine pale yellow. °§ Take over-the-counter or prescription medicines. °§ Eat foods that are high in fiber, such as beans, whole grains, and fresh fruits and vegetables. °§ Limit foods that are high in fat and processed sugars, such as fried or sweet foods. °Activity °· Gradually return to your normal activities as told by your health care provider. °· Avoid activities that take a lot of effort and energy (are strenuous) until approved by your health care provider. Walking at a slow to moderate pace is usually safe. Ask your health care provider what activities are safe for you. °? Do not lift anything that is heavier than your baby or 10 lb (4.5 kg) as told by your health care provider. °? Do not vacuum, climb stairs, or drive a car for as long as told by your health care provider. °· If possible, have someone help you at home until you are able to do your usual activities yourself. °· Rest as much as possible. Try to rest or take naps while your baby is sleeping. °Vaginal bleeding °· It is normal to have vaginal bleeding  (lochia) after delivery. Wear a sanitary pad to absorb vaginal bleeding and discharge. °? During the first week after delivery, the amount and appearance of lochia is often similar to a menstrual period. °? Over the next few weeks, it will gradually decrease to a dry, yellow-brown discharge. °? For most women, lochia stops completely by 4-6 weeks after delivery. Vaginal bleeding can vary from woman to woman. °· Change your sanitary pads frequently. Watch for any changes in your flow, such as: °? A sudden increase in volume. °? A change in color. °? Large blood clots. °· If you pass a blood clot, save it and call your health care provider to discuss. Do not flush blood clots down the toilet before you get instructions from your health care provider. °· Do not use tampons or douches until your health care provider says this is safe. °· If you are not breastfeeding, your period should return 6-8 weeks after delivery. If you are breastfeeding, your period may return anytime between 8 weeks after delivery and the time that you stop breastfeeding. °Perineal care ° °· If your C-section (Cesarean section) was unplanned, and you were allowed to labor and push before delivery, you may have pain, swelling, and discomfort of the tissue between your vaginal opening and your anus (perineum). You may also have an incision in the tissue (episiotomy) or the tissue may have torn during delivery. Follow these instructions as told by your health care provider: °? Keep your perineum clean and dry as told by   your health care provider. Use medicated pads and pain-relieving sprays and creams as directed. °? If you have an episiotomy or vaginal tear, check the area every day for signs of infection. Check for: °§ Redness, swelling, or pain. °§ Fluid or blood. °§ Warmth. °§ Pus or a bad smell. °? You may be given a squirt bottle to use instead of wiping to clean the perineum area after you go to the bathroom. As you start healing, you may use  the squirt bottle before wiping yourself. Make sure to wipe gently. °? To relieve pain caused by an episiotomy, vaginal tear, or hemorrhoids, try taking a warm sitz bath 2-3 times a day. A sitz bath is a warm water bath that is taken while you are sitting down. The water should only come up to your hips and should cover your buttocks. °Breast care °· Within the first few days after delivery, your breasts may feel heavy, full, and uncomfortable (breast engorgement). You may also have milk leaking from your breasts. Your health care provider can suggest ways to help relieve breast discomfort. Breast engorgement should go away within a few days. °· If you are breastfeeding: °? Wear a bra that supports your breasts and fits you well. °? Keep your nipples clean and dry. Apply creams and ointments as told by your health care provider. °? You may need to use breast pads to absorb milk leakage. °? You may have uterine contractions every time you breastfeed for several weeks after delivery. Uterine contractions help your uterus return to its normal size. °? If you have any problems with breastfeeding, work with your health care provider or a lactation consultant. °· If you are not breastfeeding: °? Avoid touching your breasts as this can make your breasts produce more milk. °? Wear a well-fitting bra and use cold packs to help with swelling. °? Do not squeeze out (express) milk. This causes you to make more milk. °Intimacy and sexuality °· Ask your health care provider when you can engage in sexual activity. This may depend on your: °? Risk of infection. °? Healing rate. °? Comfort and desire to engage in sexual activity. °· You are able to get pregnant after delivery, even if you have not had your period. If desired, talk with your health care provider about methods of family planning or birth control (contraception). °Lifestyle °· Do not use any products that contain nicotine or tobacco, such as cigarettes, e-cigarettes,  and chewing tobacco. If you need help quitting, ask your health care provider. °· Do not drink alcohol, especially if you are breastfeeding. °Eating and drinking ° °· Drink enough fluid to keep your urine pale yellow. °· Eat high-fiber foods every day. These may help prevent or relieve constipation. High-fiber foods include: °? Whole grain cereals and breads. °? Brown rice. °? Beans. °? Fresh fruits and vegetables. °· Take your prenatal vitamins until your postpartum checkup or until your health care provider tells you it is okay to stop. °General instructions °· Keep all follow-up visits for you and your baby as told by your health care provider. Most women visit their health care provider for a postpartum checkup within the first 3-6 weeks after delivery. °Contact a health care provider if you: °· Feel unable to cope with the changes that a new baby brings to your life, and these feelings do not go away. °· Feel unusually sad or worried. °· Have breasts that are painful, hard, or turn red. °· Have a fever. °·   Have trouble holding urine or keeping urine from leaking. °· Have little or no interest in activities you used to enjoy. °· Have not breastfed at all and you have not had a menstrual period for 12 weeks after delivery. °· Have stopped breastfeeding and you have not had a menstrual period for 12 weeks after you stopped breastfeeding. °· Have questions about caring for yourself or your baby. °· Pass a blood clot from your vagina. °Get help right away if you: °· Have chest pain. °· Have difficulty breathing. °· Have sudden, severe leg pain. °· Have severe pain or cramping in your abdomen. °· Bleed from your vagina so much that you fill more than one sanitary pad in one hour. Bleeding should not be heavier than your heaviest period. °· Develop a severe headache. °· Faint. °· Have blurred vision or spots in your vision. °· Have a bad-smelling vaginal discharge. °· Have thoughts about hurting yourself or your  baby. °If you ever feel like you may hurt yourself or others, or have thoughts about taking your own life, get help right away. You can go to your nearest emergency department or call: °· Your local emergency services (911 in the [place] °· A suicide crisis helpline, such as the National Suicide Prevention Lifeline at 1-800-273-8255. This is open 24 hours a day. °Summary °· The period of time from when you deliver your baby to up to 6-12 weeks after delivery is called the postpartum period. °· Gradually return to your normal activities as told by your health care provider. °· Keep all follow-up visits for you and your baby as told by your health care provider. °This information is not intended to replace advice given to you by your health care provider. Make sure you discuss any questions you have with your health care provider. °Document Released: 11/21/2000 Document Revised: 07/14/2018 Document Reviewed: 07/14/2018 °Elsevier Patient Education © 2020 [employer] Inc[employer] ° °

## 2019-06-16 NOTE — Progress Notes (Signed)
D/C instructions provided, pt states understanding, aware of follow up appt.  D/C home to car via wheelchair.

## 2019-06-16 NOTE — Discharge Summary (Signed)
OB Discharge Summary     Patient Name: Lori PhoenixLori D Lovin DOB: Sep 25, 1991 MRN: [id number]  Date of admission: 06/14/2019 Delivering MD: Adelene Idlerhristanna Schuman, MD Date of Delivery: 06/14/2019  Date of discharge: 06/16/2019  Admitting diagnosis: REPEAT CESAREAN SECTION DESIRES STERLIZATION Intrauterine pregnancy: 2870w2d     Secondary diagnosis: None     Discharge diagnosis: Term Pregnancy Delivered, Bilateral tubal ligation                         Hospital course:  Scheduled C/S   28 y.o. yo G3P3003 at 5270w2d was admitted to the hospital 06/14/2019 for scheduled cesarean section with the following indication:Elective Repeat.  Membrane Rupture Time/Date: 8:29 AM ,06/14/2019   Patient delivered a Viable infant.06/14/2019  Details of operation can be found in separate operative note.  Pateint had an uncomplicated postpartum course.  She is ambulating, tolerating a regular diet, passing flatus, and urinating well. Patient is discharged home in stable condition on  06/16/19                                                                        Post partum procedures: None  Complications: None  Physical exam on 06/16/2019: Vitals:   06/15/19 0823 06/15/19 1538 06/15/19 2258 06/16/19 0744  BP: 107/75 110/67 136/69 126/82  Pulse: 63 99 (!) 106 98  Resp: 18 20 18 18   Temp: 97.8 F (36.6 C) 98.6 F (37 C) 98.2 F (36.8 C) 97.9 F (36.6 C)  TempSrc: Oral Oral Oral Oral  SpO2: 100%  99% 100%  Weight:      Height:       General: alert, cooperative and no distress Lochia: appropriate Uterine Fundus: firm Incision: Dressing is clean, dry, and intact DVT Evaluation: No evidence of DVT seen on physical exam.  Labs: Lab Results  Component Value Date   WBC 10.7 (H) 06/15/2019   HGB 9.3 (L) 06/15/2019   HCT 29.7 (L) 06/15/2019   MCV 83.2 06/15/2019   PLT 143 (L) 06/15/2019   CMP Latest Ref Rng & Units 05/25/2019  Glucose 70 - 99 mg/dL -  BUN 6 - 20 mg/dL -  Creatinine 4.090.57 - 8.111.00 mg/dL 9.14(N0.52(L)   Sodium 829135 - 145 mmol/L -  Potassium 3.5 - 5.1 mmol/L -  Chloride 98 - 111 mmol/L -  CO2 22 - 32 mmol/L -  Calcium 8.9 - 10.3 mg/dL -  Total Protein 6.5 - 8.1 g/dL -  Total Bilirubin 0.3 - 1.2 mg/dL -  Alkaline Phos 38 - 562126 U/L -  AST 15 - 41 U/L -  ALT 0 - 44 U/L -    Discharge instruction: per After Visit Summary.  Medications:  Allergies as of 06/16/2019      Reactions   Peppermint Oil Rash   Diphenhydramine Hcl Rash   Told she had some reaction as a young child      Medication List    TAKE these medications   oxyCODONE-acetaminophen 5-325 MG tablet Commonly known as: PERCOCET/ROXICET Take 1 tablet by mouth every 6 (six) hours as needed.   Prenatal 27-1 MG Tabs TAKE 1 TABLET BY MOUTH ONCE DAILY            Discharge  Care Instructions  (From admission, onward)         Start     Ordered   06/16/19 0000  Discharge wound care:    Comments: You may apply a light dressing for minor discharge from the incision or to keep waistbands of clothing from rubbing.  You may also have been discharge with a clear dressing in which case this will be removed at your postoperative clinic visit.  You may shower, use soap on your incision.  Avoid baths or soaking the incision in the first 6 weeks following your surgery.Marland Kitchen   06/16/19 1148          Diet: routine diet  Activity: Advance as tolerated. Pelvic rest for 6 weeks.   Outpatient follow up: Follow-up Information    Schuman, Stefanie Libel, MD. Schedule an appointment as soon as possible for a visit in 1 week(s).   Specialty: Obstetrics and Gynecology Why: Post-operative visit Contact information: [address] [place] [address] [phone]             Postpartum contraception: Tubal Ligation Rhogam Given postpartum: no Rubella vaccine given postpartum: no Varicella vaccine given postpartum: no TDaP given antepartum or postpartum: Yes, antepartum  Newborn Data: Live born female  Birth Weight: 7  lb 6.2 oz (3350 g) APGAR: 8, 9  Newborn Delivery   Birth date/time: 06/14/2019 08:31:00 Delivery type: C-Section, Vacuum Assisted Trial of labor: No C-section categorization: Repeat       Baby Feeding: Formula  Disposition: home with mother  SIGNED: Rexene Agent, CNM 06/16/2019 11:53 AM

## 2019-06-22 ENCOUNTER — Ambulatory Visit: Payer: Medicaid Other | Admitting: Obstetrics and Gynecology

## 2019-06-29 ENCOUNTER — Other Ambulatory Visit: Payer: Self-pay | Admitting: Obstetrics and Gynecology

## 2019-06-29 DIAGNOSIS — J069 Acute upper respiratory infection, unspecified: Secondary | ICD-10-CM

## 2019-06-30 ENCOUNTER — Ambulatory Visit: Payer: Medicaid Other | Admitting: Obstetrics and Gynecology

## 2019-06-30 ENCOUNTER — Other Ambulatory Visit: Payer: Self-pay | Admitting: Internal Medicine

## 2019-06-30 DIAGNOSIS — Z20822 Contact with and (suspected) exposure to covid-19: Secondary | ICD-10-CM

## 2019-07-03 LAB — NOVEL CORONAVIRUS, NAA: SARS-CoV-2, NAA: NOT DETECTED

## 2019-07-04 ENCOUNTER — Telehealth: Payer: Self-pay

## 2019-07-04 NOTE — Telephone Encounter (Signed)
FMLA/DISABILITY form for [employer] for pt's mom, Payton Doughty, filled out, signature obtained and given to KT for processing.

## 2019-07-08 ENCOUNTER — Other Ambulatory Visit: Payer: Self-pay

## 2019-07-08 ENCOUNTER — Ambulatory Visit (INDEPENDENT_AMBULATORY_CARE_PROVIDER_SITE_OTHER): Payer: Medicaid Other | Admitting: Obstetrics and Gynecology

## 2019-07-08 ENCOUNTER — Encounter: Payer: Self-pay | Admitting: Obstetrics and Gynecology

## 2019-07-08 NOTE — Progress Notes (Signed)
  OBSTETRICS POSTPARTUM CLINIC PROGRESS NOTE  Subjective:     Lori Velasquez is a 28 y.o. G21P3003 female who presents for a postpartum visit. She is 2 weeks postpartum following a Term pregnancy and delivery by C-section repeat; no problems after deliver.  I have fully reviewed the prenatal and intrapartum course. Anesthesia: spinal.  Postpartum course has been complicated by uncomplicated.  Baby is feeding by Bottle.  Bleeding: patient has not  resumed menses.  Bowel function is normal. Bladder function is normal.  Patient is not sexually active. Contraception method desired is tubal ligation.  Postpartum depression screening: negative.  The following portions of the patient's history were reviewed and updated as appropriate: allergies, current medications, past family history, past medical history, past social history, past surgical history and problem list.  Review of Systems Pertinent items are noted in HPI.  Objective:    BP 112/70   Pulse 83   Ht 4\' 11"  (1.499 m)   Wt 177 lb (80.3 kg)   Breastfeeding No   BMI 35.75 kg/m   General:  alert and no distress   Breasts:  inspection negative, no nipple discharge or bleeding, no masses or nodularity palpable  Lungs: clear to auscultation bilaterally  Heart:  regular rate and rhythm, S1, S2 normal, no murmur, click, rub or gallop  Abdomen: soft, non-tender; bowel sounds normal; no masses,  no organomegaly.   Well healed Pfannenstiel incision   Vulva:  normal  Vagina: normal vagina, no discharge, exudate, lesion, or erythema  Cervix:  no cervical motion tenderness and no lesions  Corpus: normal size, contour, position, consistency, mobility, non-tender  Adnexa:  normal adnexa and no mass, fullness, tenderness  Rectal Exam: Not performed.          Assessment:  Post Partum Care visit There are no diagnoses linked to this encounter.  Plan:  See orders and Patient Instructions  2019 pap NIL  Follow up in: 4 weeks or as  needed.   Adrian Prows MD [facility], [facility] 07/08/2019 1:41 PM

## 2019-07-22 ENCOUNTER — Telehealth: Payer: Self-pay

## 2019-07-22 NOTE — Telephone Encounter (Signed)
Pt called stating she thinks she has a UTI. She is wanting a Rx called in  I spoke to pt letting her know she would need to be seen, however we have no openings here in clinic. I advised dropping off a urine and we could send it for a culture. Also advised she could go to PCP or Urgent Care. She opted to be seen at Urgent Care.

## 2019-08-05 ENCOUNTER — Ambulatory Visit (INDEPENDENT_AMBULATORY_CARE_PROVIDER_SITE_OTHER): Payer: Medicaid Other | Admitting: Obstetrics and Gynecology

## 2019-08-05 ENCOUNTER — Other Ambulatory Visit: Payer: Self-pay

## 2019-08-05 ENCOUNTER — Encounter: Payer: Self-pay | Admitting: Obstetrics and Gynecology

## 2019-08-05 DIAGNOSIS — Z1389 Encounter for screening for other disorder: Secondary | ICD-10-CM

## 2019-08-05 NOTE — Progress Notes (Signed)
  OBSTETRICS POSTPARTUM CLINIC PROGRESS NOTE  Subjective:     Lori Velasquez is a 28 y.o. G13P3003 female who presents for a postpartum visit. She is 6 weeks postpartum following a Term pregnancy and delivery by C-section repeat; no problems after deliver.  I have fully reviewed the prenatal and intrapartum course. Anesthesia: spinal.  Postpartum course has been complicated by uncomplicated.  Baby is feeding by Bottle.  Bleeding: patient has not  resumed menses.  Bowel function is normal. Bladder function is normal.  Patient is sexually active. Contraception method desired is tubal ligation.  Postpartum depression screening: negative. [place] 0.  The following portions of the patient's history were reviewed and updated as appropriate: allergies, current medications, past family history, past medical history, past social history, past surgical history and problem list.  Review of Systems  Pertinent items are noted in HPI.  Objective:    BP 110/64   Ht 4\' 11"  (1.499 m)   Wt 178 lb (80.7 kg)   Breastfeeding No   BMI 35.95 kg/m   General:  alert and no distress   Breasts:  inspection negative, no nipple discharge or bleeding, no masses or nodularity palpable  Lungs: clear to auscultation bilaterally  Heart:  regular rate and rhythm, S1, S2 normal, no murmur, click, rub or gallop  Abdomen: soft, non-tender; bowel sounds normal; no masses,  no organomegaly.   Well healed Pfannenstiel incision                            Assessment:  Post Partum Care visit There are no diagnoses linked to this encounter.  Plan:  See orders and Patient Instructions Follow up in: 12 months   or as needed.   Adrian Prows MD [facility] 08/05/2019 9:00 AM

## 2020-03-27 DIAGNOSIS — Z87798 Personal history of other (corrected) congenital malformations: Secondary | ICD-10-CM

## 2020-03-27 DIAGNOSIS — R4189 Other symptoms and signs involving cognitive functions and awareness: Secondary | ICD-10-CM | POA: Insufficient documentation

## 2020-03-27 DIAGNOSIS — E669 Obesity, unspecified: Secondary | ICD-10-CM

## 2020-03-28 ENCOUNTER — Ambulatory Visit: Payer: Medicaid Other | Admitting: Advanced Practice Midwife

## 2020-03-28 ENCOUNTER — Other Ambulatory Visit: Payer: Self-pay

## 2020-03-28 ENCOUNTER — Encounter: Payer: Self-pay | Admitting: Advanced Practice Midwife

## 2020-03-28 ENCOUNTER — Ambulatory Visit: Payer: Medicaid Other

## 2020-03-28 DIAGNOSIS — Z113 Encounter for screening for infections with a predominantly sexual mode of transmission: Secondary | ICD-10-CM

## 2020-03-28 DIAGNOSIS — Z9851 Tubal ligation status: Secondary | ICD-10-CM | POA: Insufficient documentation

## 2020-03-28 LAB — WET PREP FOR TRICH, YEAST, CLUE
Trichomonas Exam: NEGATIVE
Yeast Exam: NEGATIVE

## 2020-03-28 NOTE — Progress Notes (Signed)
[facility] Department STI clinic/screening visit  Subjective:  Lori Velasquez is a 29 y.o. MWF G3P3 nonsmoker female being seen today for an STI screening visit due to intermittent "bump on labia x>1 year.  She showed it to her OB provider and she told her it would "go away". The patient reports they do have symptoms.  Patient reports that they do not desire a pregnancy in the next year.   They reported they are not interested in discussing contraception today.  Patient's last menstrual period was 02/12/2020.   Patient has the following medical conditions:   Patient Active Problem List   Diagnosis Date Noted  . H/O tubal ligation 03/28/2020  . Cognitive challenges 03/27/2020  . Iron deficiency anemia 03/11/2018  . 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 Obesity, unspecified 04/08/2016    Chief Complaint  Patient presents with  . SEXUALLY TRANSMITTED DISEASE    HPI  Patient reports "bump on labia" intermittently >1 year that burns when voids and itches.  Pt states she believes onset was when couldn't put in [place] properly and it was rubbing before conceived last baby  See flowsheet for further details and programmatic requirements.    The following portions of the patient's history were reviewed and updated as appropriate: allergies, current medications, past medical history, past social history, past surgical history and problem list.  Objective:  There were no vitals filed for this visit.  Physical Exam Vitals and nursing note reviewed.  Constitutional:      Appearance: Normal appearance.  HENT:     Head: Normocephalic and atraumatic.     Mouth/Throat:     Mouth: Mucous membranes are moist.     Pharynx: Oropharynx is clear. No oropharyngeal exudate or posterior oropharyngeal erythema.  Pulmonary:     Effort: Pulmonary effort is normal.  Abdominal:     General: Abdomen is flat.     Palpations: Abdomen is soft. There is no mass.     Tenderness: There  is no abdominal tenderness. There is no rebound.     Comments: Soft without tenderness, poor tone, increased adipose  Genitourinary:    General: Normal vulva.     Exam position: Lithotomy position.     Pubic Area: No rash or pubic lice.      Labia:        Right: No rash or lesion.        Left: No rash or lesion.      Vagina: Vaginal discharge (small amt white creamy leukorrhea, ph<4.5) present. No erythema, bleeding or lesions.     Cervix: Normal.     Uterus: Normal.      Adnexa: Right adnexa normal and left adnexa normal.     Rectum: Normal.       Comments: At posterior forchette are 2 small superficial fissures coming from a sl edematous round fissure. C/o burning when voiding and itching at this area.  Pt states believes it happened when she wasn't iinserting Nuvaring properly.  HSV culture done Lymphadenopathy:     Head:     Right side of head: No preauricular or posterior auricular adenopathy.     Left side of head: No preauricular or posterior auricular adenopathy.     Cervical: No cervical adenopathy.     Upper Body:     Right upper body: No supraclavicular or axillary adenopathy.     Left upper body: No supraclavicular or axillary adenopathy.     Lower Body: No right inguinal adenopathy.  No left inguinal adenopathy.  Skin:    General: Skin is warm and dry.     Findings: No rash.  Neurological:     Mental Status: She is alert and oriented to person, place, and time.      Assessment and Plan:  Lori Velasquez is a 29 y.o. female presenting to the [facility] Department for STI screening  1. 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 H/O tubal ligation      Return if symptoms worsen or fail to improve.  No future appointments.  Alberteen Spindle,  CNM

## 2020-03-28 NOTE — Progress Notes (Signed)
In due to "bump" on labia x >1 yr.; states she showed it to [facility] when pregnant and was told it would probably go away; declines HIV/RPR testing Sharlette Dense, RN Wet prep reviewed-no Tx indicated Sharlette Dense, RN

## 2020-04-11 ENCOUNTER — Telehealth: Payer: Self-pay | Admitting: Family Medicine

## 2020-04-11 NOTE — Telephone Encounter (Signed)
This RN returned pt's call at phone # provided. Pt reports that she would like to know her TR's from 03/28/2020 appt. Verified I was speaking with pt with pt's name, DOB and password. Counseled pt regarding TR's being negative. Pt with concerns on what the bump she is experiencing might be and that she is having some itching where the bump is and wants to know what she can use to help. Counseled pt that I can speak with the provider that saw her last and give her a call back and pt states understanding. This RN spoke with Arnetha Courser, CNM who saw pt on 03/28/2020 and per E. Sciora, CNM unsure about what bump is since virology results were negative, but that if it worsen or doesn't go away to RTC so they can try to culture again. Also, per Hazle Coca, CNM verbal order pt should use only Dove soap and can try monistat or any over the counter yeast cream and apply once a day at night for 2 weeks to see if that helps. This RN returned call to pt and counseled pt per E. Sciora, CNM recommendations and verbal orders and pt states understanding. Pt with no other questions or concerns at this time.Lyman Speller, RN

## 2020-04-11 NOTE — Telephone Encounter (Signed)
Patient call wants to know her test results

## 2020-05-08 ENCOUNTER — Ambulatory Visit: Payer: Medicaid Other | Admitting: Family Medicine

## 2020-05-08 ENCOUNTER — Encounter: Payer: Self-pay | Admitting: Family Medicine

## 2020-05-08 ENCOUNTER — Other Ambulatory Visit: Payer: Self-pay

## 2020-05-08 DIAGNOSIS — K644 Residual hemorrhoidal skin tags: Secondary | ICD-10-CM

## 2020-05-08 DIAGNOSIS — Z113 Encounter for screening for infections with a predominantly sexual mode of transmission: Secondary | ICD-10-CM

## 2020-05-08 NOTE — Progress Notes (Signed)
[facility] Department STI clinic/screening visit  Subjective:  Lori Velasquez is a 29 y.o. female being seen today for an STI screening visit. The patient reports they do have symptoms.  Patient reports that they do not desire a pregnancy in the next year--client has had a BTL  They reported they are not interested in discussing contraception today.  No LMP recorded (approximate).   Patient has the following medical conditions:   Patient Active Problem List   Diagnosis Date Noted  . H/O tubal ligation 03/28/2020  . Cognitive challenges 03/27/2020  . Iron deficiency anemia 03/11/2018  . 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 Obesity, unspecified 04/08/2016    No chief complaint on file.   HPI  Patient reports she was seen last month for genital itching and a bump on her external genitalia.  States all her tests were negative.  She was instructed to use Monistat externally x 2 weeks.  States she did this and continues to have itching where the bump is located.  No other symptoms. Client declines STD testing and labwork.  See flowsheet for further details and programmatic requirements.    The following portions of the patient's history were reviewed and updated as appropriate: allergies, current medications, past medical history, past social history, past surgical history and problem list.  Objective:  There were no vitals filed for this visit.  Physical Exam Vitals and nursing note reviewed.  Constitutional:      Appearance: Normal appearance.  HENT:     Head: Normocephalic and atraumatic.     Mouth/Throat:     Mouth: Mucous membranes are moist.     Pharynx: Oropharynx is clear. No oropharyngeal exudate or posterior oropharyngeal erythema.  Pulmonary:     Effort: Pulmonary effort is normal.  Abdominal:     General: Abdomen is flat.     Palpations: There is no mass.     Tenderness: There is no abdominal tenderness. There is no rebound.  Genitourinary:   General: Normal vulva.     Exam position: Lithotomy position.     Pubic Area: No rash or pubic lice.      Labia:        Right: No rash or lesion.        Left: No rash or lesion.      Urethra: No prolapse or urethral swelling.     Vagina: Normal. No vaginal discharge, erythema, bleeding or lesions.     Cervix: No lesion.     Rectum: Normal.     Comments: Skin tag noted between vaginal and rectum- area slightly erythmatous, no lesion/abrasion, discharge. Client declined bimanual exam. Lymphadenopathy:     Lower Body: No right inguinal adenopathy. No left inguinal adenopathy.  Skin:    General: Skin is warm and dry.  Neurological:     Mental Status: She is alert and oriented to person, place, and time.    Assessment and Plan:  [name] is a 29 y.o. female presenting to the [facility] Department for STI screening  1. Screening examination for venereal disease Client declined testing and bloodwork  2. Skin tag of perianal region Advised to change antifungal to OTC clotrimazole apply 2-3 times/day for up to 3-4 weeks.   Advised to keep area as dry as possible and avoid soap to external genitalia. F/U prn.  Client advised to est. PCP, she states that she has Christiana Pellant. She will be due for a pap in 2021  No follow-ups on file.  No future appointments.  Larene Pickett, FNP

## 2020-07-26 IMAGING — MR MRI PELVIS WITHOUT CONTRAST
5 of 13 series · 20 of 48 positions shown · non-contrast
Comparison: Outside obstetric scan dated 03/17/2019.

CLINICAL DATA: 27-year-old pregnant female with history of cesarean
section with reported low lying placenta and clinical concern for
placenta accreta. Patient is reportedly in the 30th week of
gestation.

EXAM:
MRI PELVIS WITHOUT CONTRAST
TECHNIQUE: Multiplanar multisequence MR imaging of the pelvis was performed. No
intravenous contrast was administered.

[Series 2: T2 · axial · 3.0mm · 0.88mm/px · z∈[-55,+220]mm · 5 of 70 slices shown]
[im 1/70]
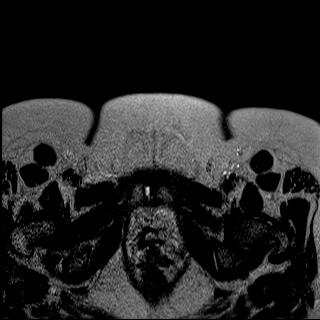
[im 18/70]
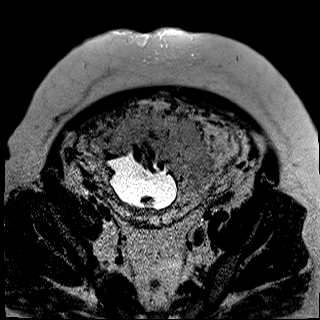
[im 35/70]
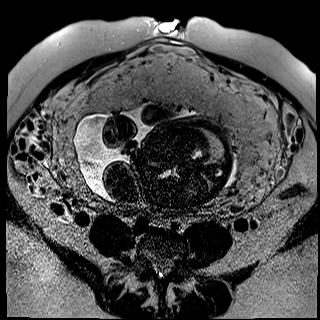
[im 52/70]
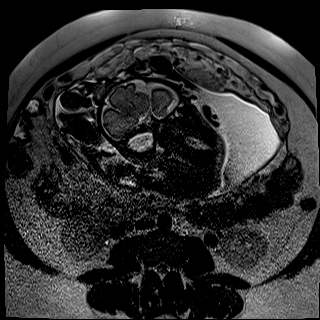
[im 70/70]
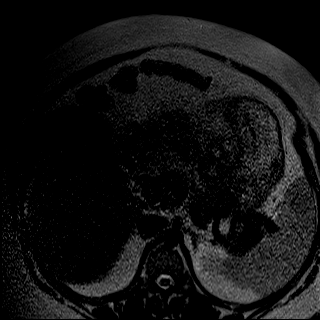

[Series 6: ax true fisp · axial · 5.0mm · 0.44mm/px · z∈[-38,+202]mm · 3 of 41 slices shown]
[im 1/41]
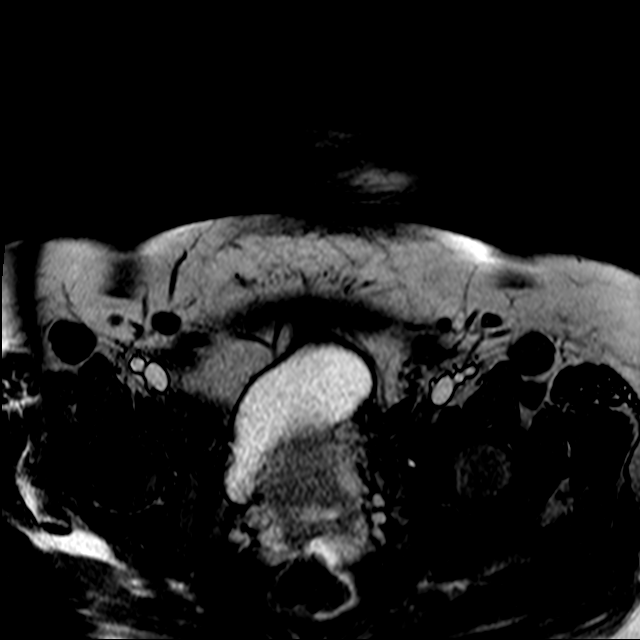
[im 21/41]
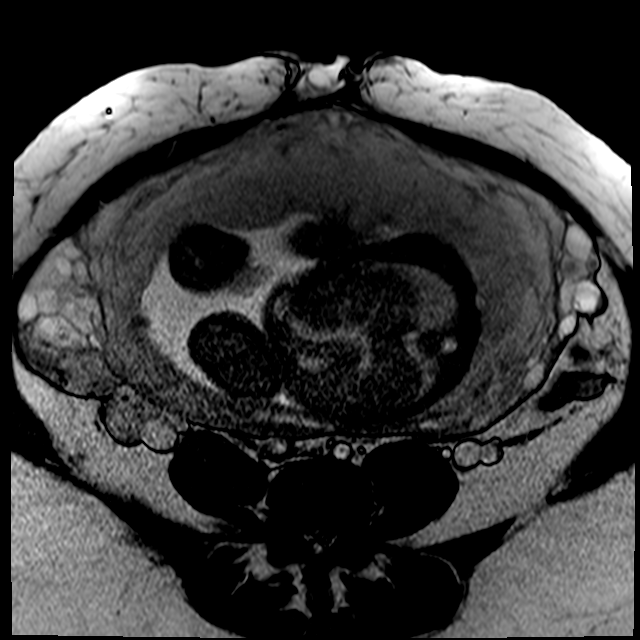
[im 41/41]
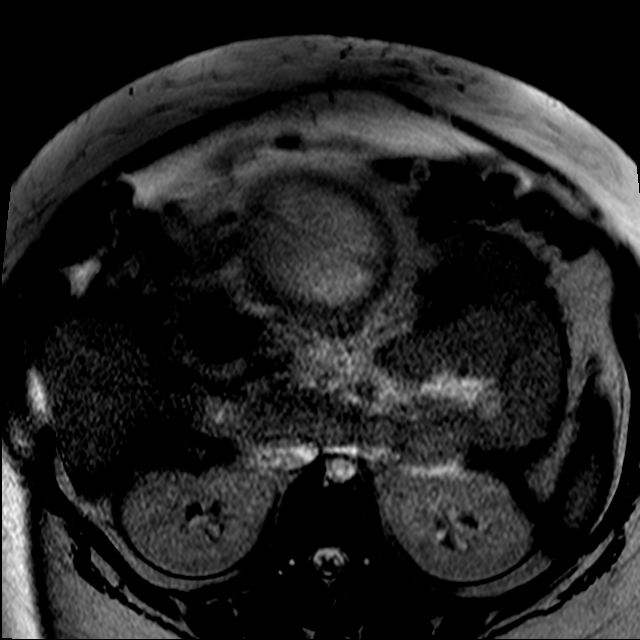

[Series 7: T1 dynamic fat-sat · axial · 3.0mm · 0.55mm/px · z∈[-36,+201]mm · 5 of 80 slices shown (1 of 3)]
[im 1/80]
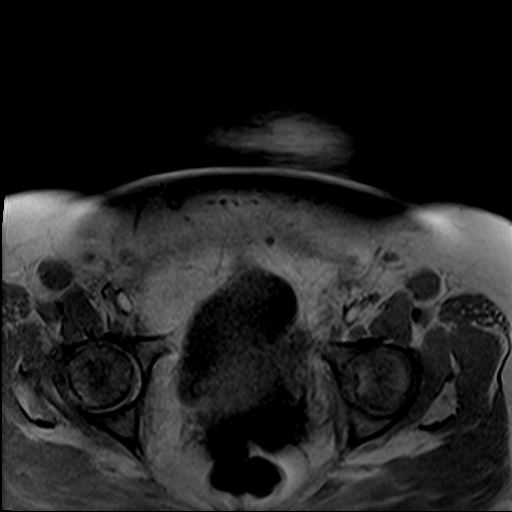
[im 20/80]
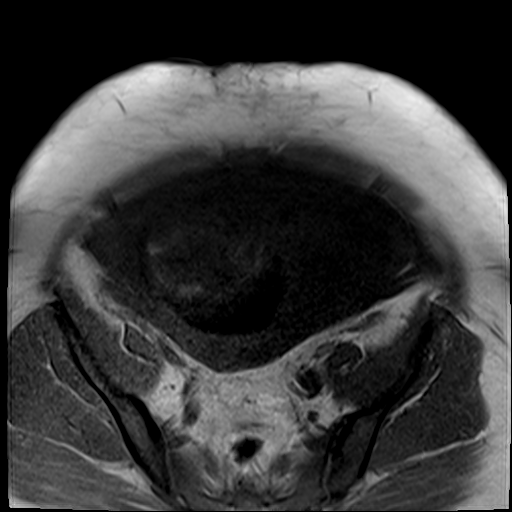
[im 40/80]
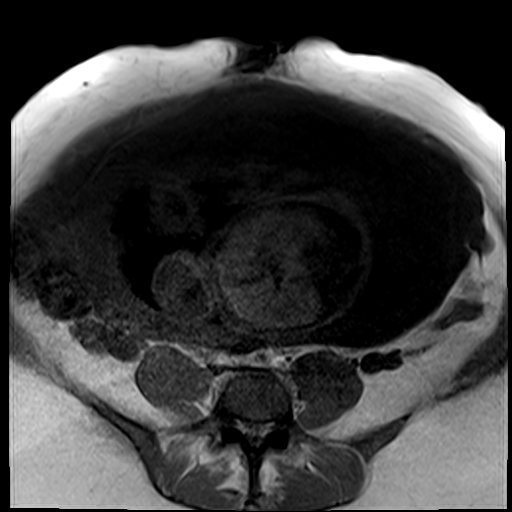
[im 60/80]
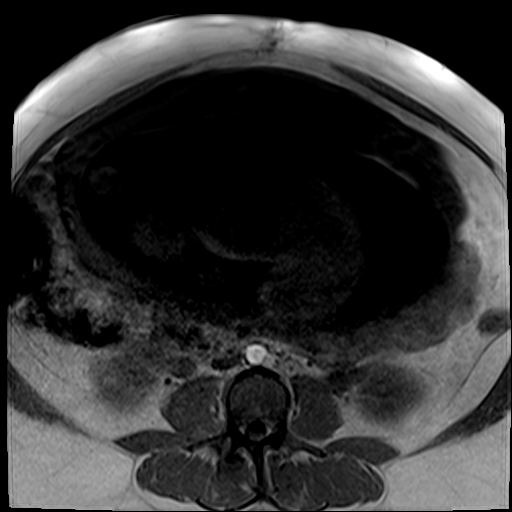
[im 80/80]
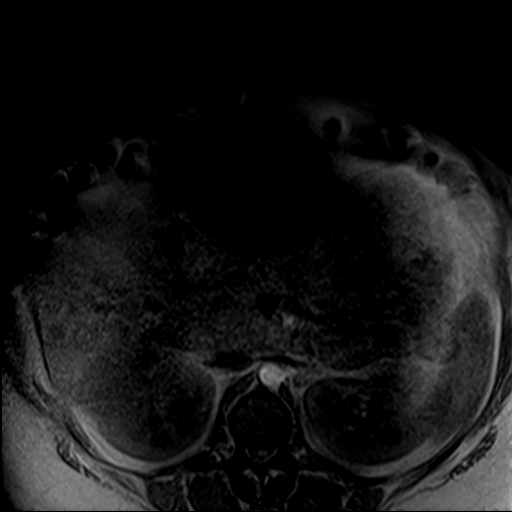

[Series 7: T1 dynamic fat-sat · axial · 3.0mm · 0.55mm/px · z∈[-36,+201]mm · 5 of 80 slices shown (2 of 3)]
[im 1/80]
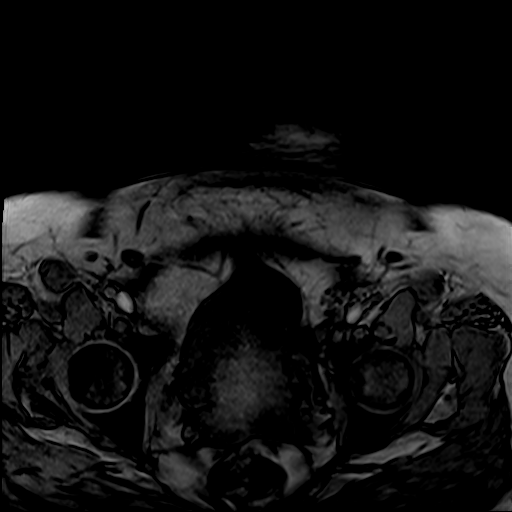
[im 20/80]
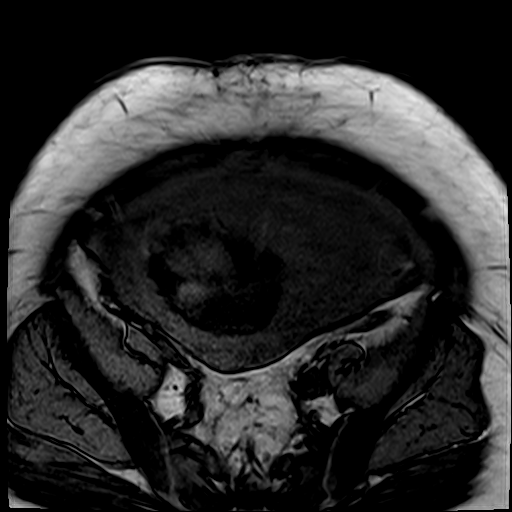
[im 40/80]
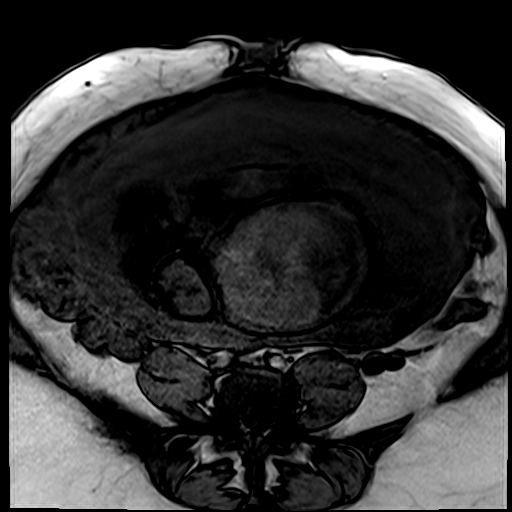
[im 60/80]
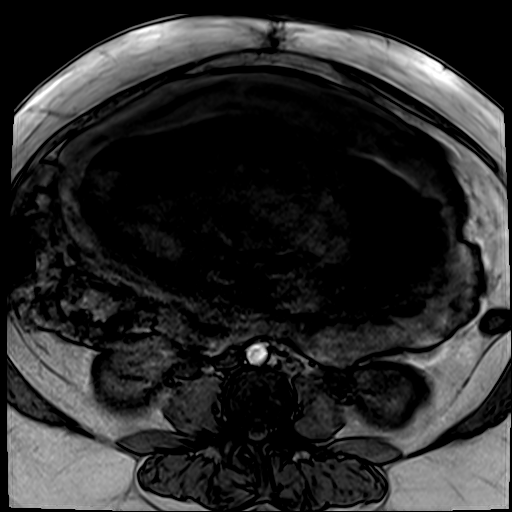
[im 80/80]
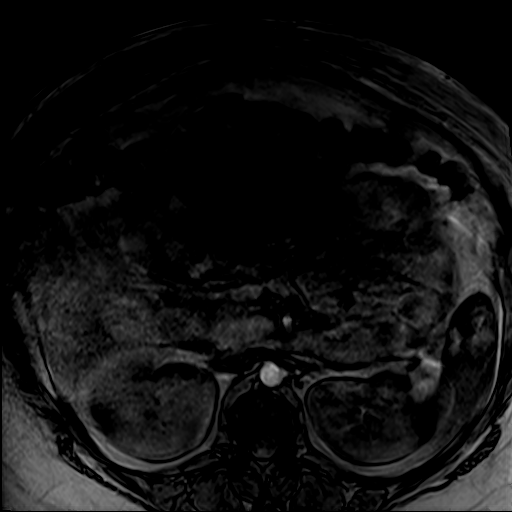

[Series 8: T1 dynamic fat-sat · axial · 3.0mm · 0.55mm/px · z∈[-36,+21]mm · 2 of 80 slices shown (3 of 3)]
[im 1/80]
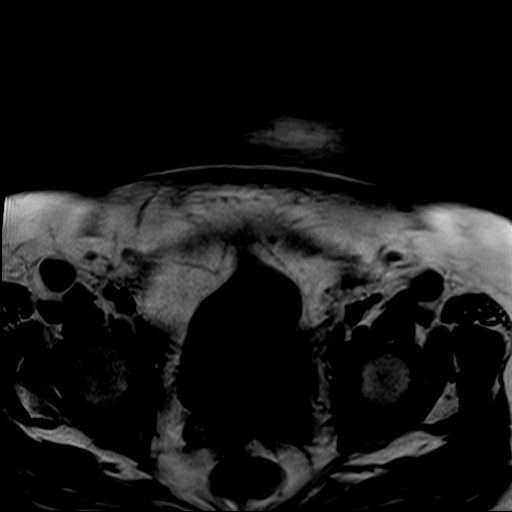
[im 20/80]
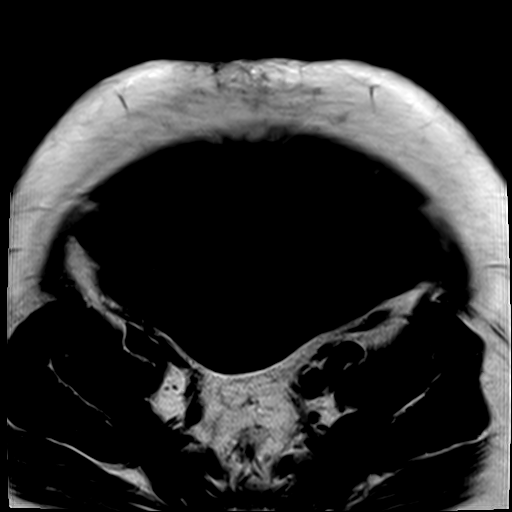

[20 of 48 positions shown; findings below may reference images not displayed]

FINDINGS: Urinary Tract:  Normal bladder, without bladder tenting.

Bowel:  No evidence of dilated or thick-walled bowel loops.

Vascular/Lymphatic: No pathologically enlarged lymph nodes in the
pelvis. No evidence of acute vascular abnormality on this
noncontrast scan.

Reproductive: Enlarged gravid uterus with single intrauterine
gestation in breech lie. No uterine fibroids. This MRI study is not
tailored for fetal evaluation. Amniotic fluid volume appears normal.
Normal cervix length (4.8 cm), with no evidence of internal cervical
funneling.

Transverse cesarean scar is seen in the anterior lower uterine
segment. Anterior placenta with no placenta previa (distance from
inferior placental margin to the internal cervical os is 2.9 cm).
The placenta is homogeneous and normal in signal intensity, with no
significant low T2 signal placental bands noted. No focal uterine
bulge. Myometrium appears intact, with no extrauterine extension of
the placenta appreciated.

Normal ovaries.  No adnexal masses.

Other:  No ascites or fluid collections.

Musculoskeletal: No aggressive appearing focal osseous lesions.
IMPRESSION: 1. Single intrauterine gestation in breech lie. This MRI study is
not tailored for fetal evaluation.
2. Anterior placenta. No placenta previa. No MRI findings of
placenta accreta spectrum.
3. Normal amniotic fluid volume.  Normal cervix.
4. No adnexal masses.

## 2021-01-23 ENCOUNTER — Encounter: Payer: Self-pay | Admitting: Emergency Medicine

## 2021-01-23 ENCOUNTER — Other Ambulatory Visit: Payer: Self-pay

## 2021-01-23 ENCOUNTER — Emergency Department: Payer: Medicaid Other

## 2021-01-23 ENCOUNTER — Emergency Department
Admission: EM | Admit: 2021-01-23 | Discharge: 2021-01-23 | Disposition: A | Discharge status: Home / Self Care | Payer: Medicaid Other | Attending: Emergency Medicine | Admitting: Emergency Medicine

## 2021-01-23 DIAGNOSIS — Z9851 Tubal ligation status: Secondary | ICD-10-CM | POA: Insufficient documentation

## 2021-01-23 DIAGNOSIS — J45909 Unspecified asthma, uncomplicated: Secondary | ICD-10-CM | POA: Diagnosis not present

## 2021-01-23 DIAGNOSIS — N390 Urinary tract infection, site not specified: Secondary | ICD-10-CM | POA: Diagnosis not present

## 2021-01-23 DIAGNOSIS — R1032 Left lower quadrant pain: Secondary | ICD-10-CM | POA: Diagnosis present

## 2021-01-23 DIAGNOSIS — R102 Pelvic and perineal pain: Secondary | ICD-10-CM

## 2021-01-23 LAB — COMPREHENSIVE METABOLIC PANEL
ALT: 29 U/L (ref 0–44)
AST: 24 U/L (ref 15–41)
Albumin: 3.9 g/dL (ref 3.5–5.0)
Alkaline Phosphatase: 104 U/L (ref 38–126)
Anion gap: 8 (ref 5–15)
BUN: 9 mg/dL (ref 6–20)
CO2: 25 mmol/L (ref 22–32)
Calcium: 9.3 mg/dL (ref 8.9–10.3)
Chloride: 105 mmol/L (ref 98–111)
Creatinine, Ser: 0.72 mg/dL (ref 0.44–1.00)
GFR, Estimated: >60 mL/min (ref >60)
Glucose, Bld: 132 mg/dL — ABNORMAL HIGH (ref 70–99)
Potassium: 4.3 mmol/L (ref 3.5–5.1)
Sodium: 138 mmol/L (ref 135–145)
Total Bilirubin: 0.5 mg/dL (ref 0.3–1.2)
Total Protein: 7.5 g/dL (ref 6.5–8.1)

## 2021-01-23 LAB — URINALYSIS, COMPLETE (UACMP) WITH MICROSCOPIC
Bilirubin Urine: NEGATIVE
Glucose, UA: NEGATIVE mg/dL
Hgb urine dipstick: NEGATIVE
Ketones, ur: NEGATIVE mg/dL
Nitrite: NEGATIVE
Protein, ur: NEGATIVE mg/dL
Specific Gravity, Urine: 1.017 (ref 1.005–1.030)
pH: 5 (ref 5.0–8.0)

## 2021-01-23 LAB — CBC
HCT: 39.6 % (ref 36.0–46.0)
Hemoglobin: 12.3 g/dL (ref 12.0–15.0)
MCH: 25.1 pg — ABNORMAL LOW (ref 26.0–34.0)
MCHC: 31.1 g/dL (ref 30.0–36.0)
MCV: 80.8 fL (ref 80.0–100.0)
Platelets: 275 10*3/uL (ref 150–400)
RBC: 4.9 MIL/uL (ref 3.87–5.11)
RDW: 13.5 % (ref 11.5–15.5)
WBC: 11.5 10*3/uL — ABNORMAL HIGH (ref 4.0–10.5)
nRBC: 0 % (ref 0.0–0.2)

## 2021-01-23 LAB — LIPASE, BLOOD: Lipase: 29 U/L (ref 11–51)

## 2021-01-23 LAB — PREGNANCY, URINE: Preg Test, Ur: NEGATIVE

## 2021-01-23 MED ORDER — NITROFURANTOIN MONOHYD MACRO 100 MG PO CAPS: 100.0000 mg | ORAL_CAPSULE | Freq: Two times a day (BID) | ORAL | 0 refills | Status: AC

## 2021-01-23 NOTE — ED Triage Notes (Signed)
Pt comes into the ED via POV c/o LLQ abdominal pain that started on Monday.  Pt went off cycle last week and denies any h/o ovarian cysts.  Pt states that she has had some residual brown discharge today.  Denies any N/V/D.

## 2021-01-23 NOTE — ED Provider Notes (Signed)
[facility] Emergency Department Provider Note   ____________________________________________   I have reviewed the triage vital signs and the nursing notes.   HISTORY  Chief Complaint Abdominal Pain   History limited by: Not Limited   HPI Lori Velasquez is a 30 y.o. female who presents to the emergency department today because of concern for left lower quadrant abdominal pain. The patient states that it started 2 days ago. The pain seems to be worse with movement. She has not noticed any change in urination or defecation. Has not had any fevers. No unusual ingestions recently. Denies similar pain in the past. No unusual vaginal discharge.    Records reviewed. Per medical record review patient has a history of asthma  Past Medical History:  Diagnosis Date  . Anemia   . Asthma   . Heart murmur    cardiac US done during last pregnancy   . Iron deficiency anemia 03/11/2018  . Short interval between pregnancies affecting pregnancy in first trimester, antepartum 08/07/2017  . Supervision of high risk pregnancy, antepartum 08/07/2017   [facility] Prenatal Labs Dating LMP Blood type: A/Positive/-- (08/31 1557)  Genetic Screen Declines Antibody:Negative (08/31 1557) Anatomic [place]  Rubella:  Varicella:   GTT Early:               Third trimester: 105 RPR: Non Reactive (08/31 1557)  Rhogam  not needed HBsAg: Negative (08/31 1557)  TDaP vaccine                       Flu Shot: HIV:   neg Baby Food    Breast/Bottle                       Patient Active Problem List   Diagnosis Date Noted  . H/O tubal ligation 03/28/2020  . Cognitive challenges 03/27/2020  . Iron deficiency anemia 03/11/2018  . 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 Obesity, unspecified 04/08/2016    Past Surgical History:  Procedure Laterality Date  . CESAREAN SECTION    . CESAREAN SECTION N/A 04/01/2018   Procedure: CESAREAN SECTION;  Surgeon: Natale Milch, MD;  Location: [facility] ORS;   Service: Obstetrics;  Laterality: N/A;  . CESAREAN SECTION WITH BILATERAL TUBAL LIGATION N/A 06/14/2019   Procedure: CESAREAN SECTION WITH BILATERAL TUBAL LIGATION;  Surgeon: Natale Milch, MD;  Location: [facility] ORS;  Service: Obstetrics;  Laterality: N/A;  . surgery for esophagela atresia as infant      Prior to Admission medications   Not on File    Allergies Peppermint oil and Diphenhydramine hcl  Family History  Problem Relation Age of Onset  . Anemia Mother   . Diabetes Maternal Grandfather     Social History Social History   Tobacco Use  . Smoking status: Never Smoker  . Smokeless tobacco: Never Used  Vaping Use  . Vaping Use: Never used  Substance Use Topics  . Alcohol use: No  . Drug use: No    Review of Systems Constitutional: No fever/chills Eyes: No visual changes. ENT: No sore throat. Cardiovascular: Denies chest pain. Respiratory: Denies shortness of breath. Gastrointestinal: Positive for left lower quadrant pain. Genitourinary: Negative for dysuria. Musculoskeletal: Negative for back pain. Skin: Negative for rash. Neurological: Negative for headaches, focal weakness or numbness.  ____________________________________________   PHYSICAL EXAM:  VITAL SIGNS: ED Triage Vitals  Enc Vitals Group     BP 01/23/21 1845 (!) 136/97  Pulse Rate 01/23/21 1845 94     Resp 01/23/21 1845 17     Temp 01/23/21 1845 98.3 F (36.8 C)     Temp Source 01/23/21 1845 Oral     SpO2 01/23/21 1845 100 %     Weight 01/23/21 1846 200 lb (90.7 kg)     Height 01/23/21 1846 4\' 11"  (1.499 m)     Head Circumference --      Peak Flow --      Pain Score 01/23/21 1846 8   Constitutional: Alert and oriented.  Eyes: Conjunctivae are normal.  ENT      Head: Normocephalic and atraumatic.      Nose: No congestion/rhinnorhea.      Mouth/Throat: Mucous membranes are moist.      Neck: No stridor. Hematological/Lymphatic/Immunilogical: No cervical  lymphadenopathy. Cardiovascular: Normal rate, regular rhythm.  No murmurs, rubs, or gallops.  Respiratory: Normal respiratory effort without tachypnea nor retractions. Breath sounds are clear and equal bilaterally. No wheezes/rales/rhonchi. Gastrointestinal: Soft and tender in the left lower quadrant.  Genitourinary: Deferred Musculoskeletal: Normal range of motion in all extremities. No lower extremity edema. Neurologic:  Normal speech and language. No gross focal neurologic deficits are appreciated.  Skin:  Skin is warm, dry and intact. No rash noted. Psychiatric: Mood and affect are normal. Speech and behavior are normal. Patient exhibits appropriate insight and judgment.  ____________________________________________    LABS (pertinent positives/negatives)  CMP wnl except glu 132 CBC wbc 11.5, hgb 12.3, plt 275 Lipase 29 UA hazy, small leukocytes, 0-5 rbc, 11-20 wbc Upreg negative ____________________________________________   EKG  None  ____________________________________________    RADIOLOGY  01/25/21 pelvis Small amount of free fluid. No ovarian pathology  ____________________________________________   PROCEDURES  Procedures  ____________________________________________   INITIAL IMPRESSION / ASSESSMENT AND PLAN / ED COURSE  Pertinent labs & imaging results that were available during my care of the patient were reviewed by me and considered in my medical decision making (see chart for details).   Patient presented to the emergency department with some left lower quadrant abdominal pain today.  On exam she does have some tenderness there.  No right lower quadrant tenderness.  Blood work without any concerning leukocytosis.  Urine does have some findings consistent with infection.  Patient denies any abnormal vaginal discharge.  I did obtain an ultrasound to evaluate for possible ovarian pathology however no ovarian torsion or tubo-ovarian abscess was appreciated.   Patient did have some free fluid in the pelvis which I imagine is normal.  This point do think patient likely suffering from urinary tract infection.  I discussed this with the patient.  Plan on discharging with antibiotics.  I discussed strict return precautions with patient. ____________________________________________   FINAL CLINICAL IMPRESSION(S) / ED DIAGNOSES  Final diagnoses:  Left adnexal tenderness  Lower urinary tract infectious disease     Note: This dictation was prepared with Dragon dictation. Any transcriptional errors that result from this process are unintentional     [place], MD 01/23/21 2329

## 2021-01-23 NOTE — Discharge Instructions (Addendum)
Please seek medical attention for any high fevers, chest pain, shortness of breath, change in behavior, persistent vomiting, bloody stool or any other new or concerning symptoms.  

## 2021-09-30 ENCOUNTER — Other Ambulatory Visit: Payer: Self-pay

## 2021-09-30 ENCOUNTER — Emergency Department
Admission: EM | Admit: 2021-09-30 | Discharge: 2021-09-30 | Disposition: A | Discharge status: Home / Self Care | Payer: Medicaid Other | Attending: Emergency Medicine | Admitting: Emergency Medicine

## 2021-09-30 DIAGNOSIS — Z5321 Procedure and treatment not carried out due to patient leaving prior to being seen by health care provider: Secondary | ICD-10-CM | POA: Insufficient documentation

## 2021-09-30 DIAGNOSIS — K0889 Other specified disorders of teeth and supporting structures: Secondary | ICD-10-CM | POA: Insufficient documentation

## 2021-09-30 NOTE — ED Triage Notes (Signed)
Pt comes with c/o dental pain to left side. Pt denies any relief with meds.

## 2021-10-25 ENCOUNTER — Ambulatory Visit
Admission: EM | Admit: 2021-10-25 | Discharge: 2021-10-25 | Disposition: A | Discharge status: Home / Self Care | Payer: Medicaid Other | Attending: Emergency Medicine | Admitting: Emergency Medicine

## 2021-10-25 ENCOUNTER — Other Ambulatory Visit: Payer: Self-pay

## 2021-10-25 ENCOUNTER — Encounter: Payer: Self-pay | Admitting: Emergency Medicine

## 2021-10-25 DIAGNOSIS — K0510 Chronic gingivitis, plaque induced: Secondary | ICD-10-CM

## 2021-10-25 DIAGNOSIS — K0889 Other specified disorders of teeth and supporting structures: Secondary | ICD-10-CM | POA: Diagnosis not present

## 2021-10-25 MED ORDER — CLINDAMYCIN HCL 300 MG PO CAPS: 300.0000 mg | ORAL_CAPSULE | Freq: Three times a day (TID) | ORAL | 0 refills | Status: DC

## 2021-10-25 NOTE — Discharge Instructions (Signed)
Take the antibiotic as prescribed.    A dental resource guide is attached.  Please call to make an appointment with a dentist as soon as possible.      

## 2021-10-25 NOTE — ED Triage Notes (Signed)
Pt c/o left side dental pain x 2 weeks

## 2021-10-25 NOTE — ED Provider Notes (Signed)
[facility]    CSN: [id number] Arrival date & time: 10/25/21  1325      History   Chief Complaint Chief Complaint  Patient presents with   Dental Pain    HPI Lori Velasquez is a 30 y.o. female.  Patient presents with left-sided upper and lower dental and gum pain x2 weeks.  She has not seen a dentist.  No fever, chills, difficulty swallowing, or other symptoms.  Treatment at home with Tylenol.  Her medical history includes asthma, anemia, obesity.  Patient denies pregnancy or breast-feeding.  The history is provided by the patient and medical records.   Past Medical History:  Diagnosis Date   Anemia    Asthma    Heart murmur    cardiac US done during last pregnancy    Iron deficiency anemia 03/11/2018   Short interval between pregnancies affecting pregnancy in first trimester, antepartum 08/07/2017   Supervision of high risk pregnancy, antepartum 08/07/2017   [facility] Prenatal Labs Dating LMP Blood type: A/Positive/-- (08/31 1557)  Genetic Screen Declines Antibody:Negative (08/31 1557) Anatomic [place]  Rubella:  Varicella:   GTT Early:               Third trimester: 105 RPR: Non Reactive (08/31 1557)  Rhogam  not needed HBsAg: Negative (08/31 1557)  TDaP vaccine                       Flu Shot: HIV:   neg Baby Food    Breast/Bottle                       Patient Active Problem List   Diagnosis Date Noted   H/O tubal ligation 03/28/2020   Cognitive challenges 03/27/2020   Iron deficiency anemia 03/11/2018   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   Obesity, unspecified 04/08/2016    Past Surgical History:  Procedure Laterality Date   CESAREAN SECTION     CESAREAN SECTION N/A 04/01/2018   Procedure: CESAREAN SECTION;  Surgeon: Natale Milch, MD;  Location: [facility] ORS;  Service: Obstetrics;  Laterality: N/A;   CESAREAN SECTION WITH BILATERAL TUBAL LIGATION N/A 06/14/2019   Procedure: CESAREAN SECTION WITH BILATERAL TUBAL LIGATION;  Surgeon: Natale Milch, MD;  Location: [facility] ORS;  Service: Obstetrics;  Laterality: N/A;   surgery for esophagela atresia as infant      OB History     Gravida  3   Para  3   Term  3   Preterm      AB      Living  3      SAB      IAB      Ectopic      Multiple  0   Live Births  3            Home Medications    Prior to Admission medications   Medication Sig Start Date End Date Taking? Authorizing Provider  clindamycin (CLEOCIN) 300 MG capsule Take 1 capsule (300 mg total) by mouth 3 (three) times daily. 10/25/21  Yes Mickie Bail, NP    Family History Family History  Problem Relation Age of Onset   Anemia Mother    Diabetes Maternal Grandfather     Social History Social History   Tobacco Use   Smoking status: Never   Smokeless tobacco: Never  Vaping Use   Vaping Use: Never used  Substance Use Topics  Alcohol use: No   Drug use: No     Allergies   Peppermint oil and Diphenhydramine hcl   Review of Systems Review of Systems  Constitutional:  Negative for chills and fever.  HENT:  Positive for dental problem. Negative for trouble swallowing.   Respiratory:  Negative for cough and shortness of breath.   All other systems reviewed and are negative.   Physical Exam Triage Vital Signs ED Triage Vitals  Enc Vitals Group     BP 10/25/21 1529 112/78     Pulse Rate 10/25/21 1529 84     Resp 10/25/21 1529 18     Temp 10/25/21 1529 98.4 F (36.9 C)     Temp Source 10/25/21 1529 Oral     SpO2 10/25/21 1529 98 %     Weight --      Height --      Head Circumference --      Peak Flow --      Pain Score 10/25/21 1527 9     Pain Loc --      Pain Edu? --      Excl. in GC? --    No data found.  Updated Vital Signs BP 112/78 (BP Location: Left Arm)   Pulse 84   Temp 98.4 F (36.9 C) (Oral)   Resp 18   LMP  (LMP Unknown)   SpO2 98%   Visual Acuity Right Eye Distance:   Left Eye Distance:   Bilateral Distance:    Right Eye Near:   Left Eye Near:     Bilateral Near:     Physical Exam Vitals and nursing note reviewed.  Constitutional:      General: She is not in acute distress.    Appearance: She is well-developed. She is obese. She is not ill-appearing.  HENT:     Head: Normocephalic and atraumatic.     Mouth/Throat:     Mouth: Mucous membranes are moist.     Dentition: Dental caries present.  Cardiovascular:     Rate and Rhythm: Normal rate and regular rhythm.     Heart sounds: Normal heart sounds.  Pulmonary:     Effort: Pulmonary effort is normal. No respiratory distress.     Breath sounds: Normal breath sounds.  Musculoskeletal:     Cervical back: Neck supple.  Skin:    General: Skin is warm and dry.  Neurological:     Mental Status: She is alert.  Psychiatric:        Mood and Affect: Mood normal.        Behavior: Behavior normal.     UC Treatments / Results  Labs (all labs ordered are listed, but only abnormal results are displayed) Labs Reviewed - No data to display  EKG   Radiology No results found.  Procedures Procedures (including critical care time)  Medications Ordered in UC Medications - No data to display  Initial Impression / Assessment and Plan / UC Course  I have reviewed the triage vital signs and the nursing notes.  Pertinent labs & imaging results that were available during my care of the patient were reviewed by me and considered in my medical decision making (see chart for details).  Dental pain and gingivitis.  Treating with clindamycin.  Dental resource guide provided and instructed patient to follow-up with a dentist as soon as possible.  Discussed Tylenol or ibuprofen as needed for discomfort.  Education provided on gingivitis.  Patient agrees to plan of care.  Final Clinical Impressions(s) / UC Diagnoses   Final diagnoses:  Pain, dental  Gingivitis     Discharge Instructions      Take the antibiotic as prescribed.    A dental resource guide is attached.  Please call  to make an appointment with a dentist as soon as possible.         ED Prescriptions     Medication Sig Dispense Auth. Provider   clindamycin (CLEOCIN) 300 MG capsule Take 1 capsule (300 mg total) by mouth 3 (three) times daily. 21 capsule Mickie Bail, NP      I have reviewed the PDMP during this encounter.   Mickie Bail, NP 10/25/21 1558

## 2022-03-28 ENCOUNTER — Ambulatory Visit
Admission: RE | Admit: 2022-03-28 | Discharge: 2022-03-28 | Disposition: A | Discharge status: Home / Self Care | Payer: Medicaid Other | Source: Ambulatory Visit | Attending: Emergency Medicine | Admitting: Emergency Medicine

## 2022-03-28 ENCOUNTER — Other Ambulatory Visit: Payer: Self-pay

## 2022-03-28 VITALS — BP 131/79 | HR 100 | Temp 98.1°F | Resp 18

## 2022-03-28 DIAGNOSIS — J029 Acute pharyngitis, unspecified: Secondary | ICD-10-CM | POA: Diagnosis present

## 2022-03-28 LAB — POCT RAPID STREP A (OFFICE): Rapid Strep A Screen: NEGATIVE

## 2022-03-28 MED ORDER — LIDOCAINE VISCOUS HCL 2 % MT SOLN: 15.0000 mL | OROMUCOSAL | 0 refills | Status: DC | PRN

## 2022-03-28 NOTE — ED Provider Notes (Signed)
?[facility] ? ? ? ?CSN: [id number] ?Arrival date & time: 03/28/22  1636 ? ? ?  ? ?History   ?Chief Complaint ?Chief Complaint  ?Patient presents with  ? Sore Throat  ? APPT 1700  ? ? ?HPI ?Lori Velasquez is a 31 y.o. female.  Patient presents with 1 week history of sore throat.  She also notes that the lymph nodes in her neck feel swollen.  She had "cold symptoms" last week but these have resolved.  She has residual occasional nonproductive cough.  No fever, rash, shortness of breath, vomiting, diarrhea, or other symptoms.  Treatment at home with OTC cold and flu medication.  Her medical history includes asthma, anemia. ? ?The history is provided by the patient and medical records.  ? ?Past Medical History:  ?Diagnosis Date  ? Anemia   ? Asthma   ? Heart murmur   ? cardiac US done during last pregnancy   ? Iron deficiency anemia 03/11/2018  ? Short interval between pregnancies affecting pregnancy in first trimester, antepartum 08/07/2017  ? Supervision of high risk pregnancy, antepartum 08/07/2017  ? [facility] Prenatal Labs Dating LMP Blood type: A/Positive/-- (08/31 1557)  Genetic Screen Declines Antibody:Negative (08/31 1557) Anatomic US  Rubella:  Varicella:   GTT Early:               Third trimester: 105 RPR: Non Reactive (08/31 1557)  Rhogam  not needed HBsAg: Negative (08/31 1557)  TDaP vaccine                       Flu Shot: HIV:   neg Baby Food    Breast/Bottle                     ? ? ?Patient Active Problem List  ? Diagnosis Date Noted  ? H/O tubal ligation 03/28/2020  ? Cognitive challenges 03/27/2020  ? Iron deficiency anemia 03/11/2018  ? 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 Obesity, unspecified 04/08/2016  ? ? ?Past Surgical History:  ?Procedure Laterality Date  ? CESAREAN SECTION    ? CESAREAN SECTION N/A 04/01/2018  ? Procedure: CESAREAN SECTION;  Surgeon: Natale MilchSchuman, Christanna R, MD;  Location: [facility];  Service: Obstetrics;  Laterality: N/A;  ? CESAREAN SECTION WITH BILATERAL TUBAL  LIGATION N/A 06/14/2019  ? Procedure: CESAREAN SECTION WITH BILATERAL TUBAL LIGATION;  Surgeon: Natale MilchSchuman, Christanna R, MD;  Location: [facility];  Service: Obstetrics;  Laterality: N/A;  ? surgery for esophagela atresia as infant    ? ? ?OB History   ? ? Gravida  ?3  ? Para  ?3  ? Term  ?3  ? Preterm  ?   ? AB  ?   ? Living  ?3  ?  ? ? SAB  ?   ? IAB  ?   ? Ectopic  ?   ? Multiple  ?0  ? Live Births  ?3  ?   ?  ?  ? ? ? ?Home Medications   ? ?Prior to Admission medications   ?Medication Sig Start Date End Date Taking? Authorizing Provider  ?lidocaine (XYLOCAINE) 2 % solution Use as directed 15 mLs in the mouth or throat as needed for mouth pain. 03/28/22  Yes Mickie Bailate, Ellisyn Icenhower H, NP  ?clindamycin (CLEOCIN) 300 MG capsule Take 1 capsule (300 mg total) by mouth 3 (three) times daily. 10/25/21   Mickie Bailate, Anaka Beazer H, NP  ? ? ?Family History ?Family History  ?  Problem Relation Age of Onset  ? Anemia Mother   ? Diabetes Maternal Grandfather   ? ? ?Social History ?Social History  ? ?Tobacco Use  ? Smoking status: Never  ? Smokeless tobacco: Never  ?Vaping Use  ? Vaping Use: Never used  ?Substance Use Topics  ? Alcohol use: No  ? Drug use: No  ? ? ? ?Allergies   ?Peppermint oil and Diphenhydramine hcl ? ? ?Review of Systems ?Review of Systems  ?Constitutional:  Negative for chills and fever.  ?HENT:  Positive for sore throat. Negative for ear pain.   ?Respiratory:  Positive for cough. Negative for shortness of breath.   ?Cardiovascular:  Negative for chest pain and palpitations.  ?Gastrointestinal:  Negative for diarrhea and vomiting.  ?Skin:  Negative for color change and rash.  ?All other systems reviewed and are negative. ? ? ?Physical Exam ?Triage Vital Signs ?ED Triage Vitals  ?Enc Vitals Group  ?   BP 03/28/22 1644 131/79  ?   Pulse Rate 03/28/22 1644 100  ?   Resp 03/28/22 1644 18  ?   Temp 03/28/22 1644 98.1 ?F (36.7 ?C)  ?   Temp src --   ?   SpO2 03/28/22 1644 97 %  ?   Weight --   ?   Height --   ?   Head Circumference --   ?    Peak Flow --   ?   Pain Score 03/28/22 1645 8  ?   Pain Loc --   ?   Pain Edu? --   ?   Excl. in GC? --   ? ?No data found. ? ?Updated Vital Signs ?BP 131/79   Pulse 100   Temp 98.1 ?F (36.7 ?C)   Resp 18   LMP 03/26/2022 (Exact Date)   SpO2 97%  ? ?Visual Acuity ?Right Eye Distance:   ?Left Eye Distance:   ?Bilateral Distance:   ? ?Right Eye Near:   ?Left Eye Near:    ?Bilateral Near:    ? ?Physical Exam ?Vitals and nursing note reviewed.  ?Constitutional:   ?   General: She is not in acute distress. ?   Appearance: She is well-developed. She is not ill-appearing.  ?HENT:  ?   Head: Normocephalic and atraumatic.  ?   Right Ear: Tympanic membrane normal.  ?   Left Ear: Tympanic membrane normal.  ?   Nose: Nose normal.  ?   Mouth/Throat:  ?   Mouth: Mucous membranes are moist.  ?   Pharynx: Posterior oropharyngeal erythema present.  ?Cardiovascular:  ?   Rate and Rhythm: Normal rate and regular rhythm.  ?   Heart sounds: Normal heart sounds.  ?Pulmonary:  ?   Effort: Pulmonary effort is normal. No respiratory distress.  ?   Breath sounds: Normal breath sounds.  ?Musculoskeletal:  ?   Cervical back: Neck supple.  ?Skin: ?   General: Skin is warm and dry.  ?Neurological:  ?   Mental Status: She is alert.  ?Psychiatric:     ?   Mood and Affect: Mood normal.     ?   Behavior: Behavior normal.  ? ? ? ?UC Treatments / Results  ?Labs ?(all labs ordered are listed, but only abnormal results are displayed) ?Labs Reviewed  ?CULTURE, GROUP A STREP [facility])  ?POCT RAPID STREP A (OFFICE)  ? ? ?EKG ? ? ?Radiology ?No results found. ? ?Procedures ?Procedures (including critical care time) ? ?Medications Ordered in UC ?Medications -  No data to display ? ?Initial Impression / Assessment and Plan / UC Course  ?I have reviewed the triage vital signs and the nursing notes. ? ?Pertinent labs & imaging results that were available during my care of the patient were reviewed by me and considered in my medical decision making (see chart  for details). ? ?Acute pharyngitis.  Rapid strep negative; culture pending.  Treating with viscous lidocaine.  Discussed other symptomatic treatment including OTC Tylenol or ibuprofen.  Instructed patient to follow up with her PCP if her symptoms are not improving.  She agrees to plan of care.  ? ? ? ?Final Clinical Impressions(s) / UC Diagnoses  ? ?Final diagnoses:  ?Acute pharyngitis, unspecified etiology  ? ? ? ?Discharge Instructions   ? ?  ?Your rapid strep test is negative.  A throat culture is pending; we will call you if it is positive requiring treatment.   ? ?Use the lidocaine as directed.  Follow up with your primary care provider if your symptoms are not improving.   ? ? ? ? ? ? ?ED Prescriptions   ? ? Medication Sig Dispense Auth. Provider  ? lidocaine (XYLOCAINE) 2 % solution Use as directed 15 mLs in the mouth or throat as needed for mouth pain. 100 mL Mickie Bail, NP  ? ?  ? ?PDMP not reviewed this encounter. ?  ?Mickie Bail, NP ?03/28/22 1711 ? ?

## 2022-03-28 NOTE — ED Triage Notes (Signed)
Patient c/o sore throat x 1 week.  ? ?Patient denies fever or N/V.  ? ?Patient endorses anterior cervical lymph node tenderness.  ? ?Patient endorses pain with talking and swallowing.  ? ?Patient has taken OTC "cold and flu medicine" with no relief of symptoms.  ?

## 2022-03-28 NOTE — Discharge Instructions (Addendum)
Your rapid strep test is negative.  A throat culture is pending; we will call you if it is positive requiring treatment.   ? ?Use the lidocaine as directed.  Follow up with your primary care provider if your symptoms are not improving.   ? ? ?

## 2022-03-31 LAB — CULTURE, GROUP A STREP (THRC)

## 2022-04-13 ENCOUNTER — Encounter: Payer: Self-pay | Admitting: Emergency Medicine

## 2022-04-13 ENCOUNTER — Emergency Department
Admission: EM | Admit: 2022-04-13 | Discharge: 2022-04-13 | Disposition: A | Discharge status: Home / Self Care | Payer: Medicaid Other | Attending: Emergency Medicine | Admitting: Emergency Medicine

## 2022-04-13 ENCOUNTER — Other Ambulatory Visit: Payer: Self-pay

## 2022-04-13 ENCOUNTER — Encounter: Payer: Self-pay | Admitting: Oncology

## 2022-04-13 DIAGNOSIS — J45909 Unspecified asthma, uncomplicated: Secondary | ICD-10-CM | POA: Insufficient documentation

## 2022-04-13 DIAGNOSIS — K0889 Other specified disorders of teeth and supporting structures: Secondary | ICD-10-CM | POA: Diagnosis present

## 2022-04-13 DIAGNOSIS — S025XXA Fracture of tooth (traumatic), initial encounter for closed fracture: Secondary | ICD-10-CM

## 2022-04-13 DIAGNOSIS — K0381 Cracked tooth: Secondary | ICD-10-CM | POA: Insufficient documentation

## 2022-04-13 MED ORDER — LIDOCAINE VISCOUS HCL 2 % MT SOLN
15.0000 mL | Freq: Once | OROMUCOSAL | Status: AC
  Administered 2022-04-13: 15 mL via OROMUCOSAL
  Filled 2022-04-13: qty 15

## 2022-04-13 MED ORDER — LIDOCAINE VISCOUS HCL 2 % MT SOLN: 15.0000 mL | OROMUCOSAL | 0 refills | Status: AC | PRN

## 2022-04-13 NOTE — ED Triage Notes (Signed)
Pt arrived via POV with reports with off and on L side dental pain, pt has broken tooth in the back. Pain worse since 0230. Pt taking ibuprofen and tylenol with no relief.  ? ?Pt does not have a dentist.  ?

## 2022-04-13 NOTE — Discharge Instructions (Signed)
OPTIONS FOR DENTAL FOLLOW UP CARE ° °[place] Department of Health and Human Services - Local Safety Net Dental Clinics °http://www.ncdhhs.gov/dph/oralhealth/services/safetynetclinics.htm °  °[facility] ([phone]) ° °[facility] [place] ([phone]) ° °[facility] ([phone] ext [phone]) ° °[facility] ([phone]) ° °[facility] ([phone]) °This clinic caters to the indigent population and is on a lottery system. °Location: °[facility], [address], [place] °Clinic Hours: °Wednesdays from 6pm - 9pm, patients seen by a lottery system. °For dates, call or go to www.med.[facility].edu/[facility]/patients/Dental-[facility] °Services: °Cleanings, fillings and simple extractions. °Payment Options: °DENTAL WORK IS FREE OF CHARGE. Bring proof of income or support. °Best way to get seen: °Arrive at 5:15 pm - this is a lottery, NOT first come/first serve, so arriving earlier will not increase your chances of being seen. °  °  °[facility] °[phone] °Select option 1 for emergencies °  °Location: °[facility], [address], [place] °Clinic Hours: °No walk-ins accepted - call the day before to schedule an appointment. °Check in times are 9:30 am and 1:30 pm. °Services: °Simple extractions, temporary fillings, pulpectomy/pulp debridement, uncomplicated abscess drainage. °Payment Options: °PAYMENT IS DUE AT THE TIME OF SERVICE.  Fee is usually $100-200, additional surgical procedures (e.g. abscess drainage) may be extra. °Cash, checks, Visa/MasterCard accepted.  Can file Medicaid if patient is covered for dental - patient should call case worker to check. °No discount for [facility] patients. °Best way to get seen: °MUST call the day before and get onto the schedule. Can usually be seen the next 1-2 days. No walk-ins accepted. °  °  °[facility] °[phone] °   °Location: °[facility], [address], [place] °Clinic Hours: °M, W, Th, F 8am or 1:30pm, Tues 9a or 1:30 - first come/first served. °Services: °Simple extractions, temporary fillings, uncomplicated abscess drainage.  You do not need to be an [place] resident. °Payment Options: °PAYMENT IS DUE AT THE TIME OF SERVICE. °Dental insurance, otherwise sliding scale - bring proof of income or support. °Depending on income and treatment needed, cost is usually $50-200. °Best way to get seen: °Arrive early as it is first come/first served. °  °  °[facility] °[phone] °  °Location: °[address] °Clinic Hours: °Mon-Thu 8a-5p °Services: °Most basic dental services including extractions and fillings. °Payment Options: °PAYMENT IS DUE AT THE TIME OF SERVICE. °Sliding scale, up to 50% off - bring proof if income or support. °Medicaid with dental option accepted. °Best way to get seen: °Call to schedule an appointment, can usually be seen within 2 weeks OR they will try to see walk-ins - show up at 8a or 2p (you may have to wait). °  °  °[facility] °[phone] °[place] RESIDENTS ONLY °  °Location: °[facility], [address], [place], [place] [address] °Clinic Hours: By appointment only. °Monday - Thursday 8am-5pm, Friday 8am-12pm °Services: Cleanings, fillings, extractions. °Payment Options: °PAYMENT IS DUE AT THE TIME OF SERVICE. °Cash, Visa or MasterCard. Sliding scale - $30 minimum per service. °Best way to get seen: °Come in to office, complete packet and make an appointment - need proof of income °or support monies for each household member and proof of [place] residence. °Usually takes about a month to get in. °  °  °[facility] °[phone] °  °Location: °[address]   [place] °Clinic Hours: Walk-in Urgent Care Dental Services are offered Monday-Friday  mornings only. °The numbers of emergencies accepted daily is limited to the number of °providers available. °Maximum 15 - Mondays, Wednesdays & Thursdays °Maximum 10 - Tuesdays & Fridays °Services: °You do not need to be a [place] resident to be seen for a dental emergency. °Emergencies are defined as pain, swelling, abnormal bleeding, or dental trauma. Walkins will receive x-rays if needed. °NOTE: Dental cleaning is not an emergency. °Payment Options: °PAYMENT IS DUE AT THE TIME OF SERVICE. °Minimum co-pay is $40.00 for uninsured patients. °Minimum co-pay is $3.00 for Medicaid with dental coverage. °Dental Insurance is accepted and must be presented at time of visit. °Medicare does not cover dental. °Forms of payment: Cash, credit card, checks. °Best way to get seen: °If not previously registered with the clinic, walk-in dental registration begins at 7:15 am and is on a first come/first serve basis. °If previously registered with the clinic, call to make an appointment. °  °  °[facility] °[phone] °[place] RESIDENTS ONLY °  °Location: °[address], [place], [place] °Clinic Hours: °Mon-Thu 10a-2p °Services: Extractions only! °Payment Options: °FREE (donations accepted) - bring proof of income or support °Best way to get seen: °Call and schedule an appointment OR come at 8am on the 1st Monday of every month (except for holidays) when it is first come/first served. °  °  °[facility] °[phone] °  °Location: °[address], [place] °Clinic Hours: °Friday mornings °Services, Payment Options, Best way to get seen: °Call for info °

## 2022-04-13 NOTE — ED Provider Notes (Signed)
? ?[facility] ?Provider Note ? ? ? Event Date/Time  ? First MD Initiated Contact with Patient 04/13/22 [phone]   ?  (approximate) ? ? ?History  ? ?Dental Pain ? ? ?HPI ? ?Lori Velasquez is a 31 y.o. female who presents for evaluation of dental pain.  Patient reports that she has had intermittent pain in that tooth for about a month.  She is complaining of pain on a cracked left upper molar tooth.  Has not seen her dentist yet.  No trismus, no fever, no drooling. ?  ? ? ?Past Medical History:  ?Diagnosis Date  ? Anemia   ? Asthma   ? Heart murmur   ? cardiac US done during last pregnancy   ? Iron deficiency anemia 03/11/2018  ? Short interval between pregnancies affecting pregnancy in first trimester, antepartum 08/07/2017  ? Supervision of high risk pregnancy, antepartum 08/07/2017  ? [facility] Prenatal Labs Dating LMP Blood type: A/Positive/-- (08/31 1557)  Genetic Screen Declines Antibody:Negative (08/31 1557) Anatomic [place]  Rubella:  Varicella:   GTT Early:               Third trimester: 105 RPR: Non Reactive (08/31 1557)  Rhogam  not needed HBsAg: Negative (08/31 1557)  TDaP vaccine                       Flu Shot: HIV:   neg Baby Food    Breast/Bottle                     ? ? ?Past Surgical History:  ?Procedure Laterality Date  ? CESAREAN SECTION    ? CESAREAN SECTION N/A 04/01/2018  ? Procedure: CESAREAN SECTION;  Surgeon: Natale Milch, MD;  Location: [facility];  Service: Obstetrics;  Laterality: N/A;  ? CESAREAN SECTION WITH BILATERAL TUBAL LIGATION N/A 06/14/2019  ? Procedure: CESAREAN SECTION WITH BILATERAL TUBAL LIGATION;  Surgeon: Natale Milch, MD;  Location: [facility];  Service: Obstetrics;  Laterality: N/A;  ? surgery for esophagela atresia as infant    ? ? ? ?Physical Exam  ? ?Triage Vital Signs: ?ED Triage Vitals  ?Enc Vitals Group  ?   BP 04/13/22 0351 124/83  ?   Pulse Rate 04/13/22 0351 71  ?   Resp 04/13/22 0351 20  ?   Temp 04/13/22 0351 97.8 ?F (36.6 ?C)  ?   Temp  Source 04/13/22 0351 Oral  ?   SpO2 04/13/22 0351 97 %  ?   Weight 04/13/22 0355 173 lb (78.5 kg)  ?   Height 04/13/22 0355 4\' 11"  (1.499 m)  ?   Head Circumference --   ?   Peak Flow --   ?   Pain Score 04/13/22 0355 8  ?   Pain Loc --   ?   Pain Edu? --   ?   Excl. in GC? --   ? ? ?Most recent vital signs: ?Vitals:  ? 04/13/22 0351 04/13/22 0613  ?BP: 124/83 (!) 124/109  ?Pulse: 71 69  ?Resp: 20 17  ?Temp: 97.8 ?F (36.6 ?C) 98.4 ?F (36.9 ?C)  ?SpO2: 97% 100%  ? ? ? ?Constitutional: Alert and oriented. Well appearing and in no apparent distress. ?HEENT: ?     Head: Normocephalic and atraumatic.    ?     Eyes: Conjunctivae are normal. Sclera is non-icteric.  ?     Mouth/Throat: Mucous membranes are moist.  Left  upper molar tooth is cracked, no abscess, floor of the mouth is soft with no induration, no trismus ?     Neck: Supple with no signs of meningismus. ?Cardiovascular: Regular rate and rhythm.  ?Respiratory: Normal respiratory effort.  ?Musculoskeletal:  No edema, cyanosis, or erythema of extremities. ?Neurologic: Normal speech and language. Face is symmetric. Moving all extremities. No gross focal neurologic deficits are appreciated. ?Skin: Skin is warm, dry and intact. No rash noted. ?Psychiatric: Mood and affect are normal. Speech and behavior are normal. ? ?ED Results / Procedures / Treatments  ? ?Labs ?(all labs ordered are listed, but only abnormal results are displayed) ?Labs Reviewed - No data to display ? ? ?EKG ? ?none ? ? ?RADIOLOGY ?none ? ? ?PROCEDURES: ? ?Critical Care performed: No ? ?Procedures ? ? ? ?IMPRESSION / MDM / ASSESSMENT AND PLAN / ED COURSE  ?I reviewed the triage vital signs and the nursing notes. ? ?31 y.o. female who presents for evaluation of dental pain.  Pain from a cracked left upper molar with no signs of abscess, Ludewig's angina, or any other complications.  Recommended dental care for extraction.  Patient given topical viscous lidocaine and sent home.  Discussed my standard  return precautions ? ?MEDICATIONS GIVEN IN ED: ?Medications  ?lidocaine (XYLOCAINE) 2 % viscous mouth solution 15 mL (15 mLs Mouth/Throat Given 04/13/22 2440)  ? ? ? ?EMR reviewed including records from last admission to the hospital from 2020 for headache and pregnancy ? ? ? ?FINAL CLINICAL IMPRESSION(S) / ED DIAGNOSES  ? ?Final diagnoses:  ?Closed fracture of tooth, initial encounter  ? ? ? ?Rx / DC Orders  ? ?ED Discharge Orders   ? ?      Ordered  ?  lidocaine (XYLOCAINE) 2 % solution  As needed       ? 04/13/22 0601  ? ?  ?  ? ?  ? ? ? ?Note:  This document was prepared using Dragon voice recognition software and may include unintentional dictation errors. ? ? ?Please note:  Patient was evaluated in Emergency Department today for the symptoms described in the history of present illness. Patient was evaluated in the context of the global COVID-19 pandemic, which necessitated consideration that the patient might be at risk for infection with the SARS-CoV-2 virus that causes COVID-19. Institutional protocols and algorithms that pertain to the evaluation of patients at risk for COVID-19 are in a state of rapid change based on information released by regulatory bodies including the CDC and federal and state organizations. These policies and algorithms were followed during the patient's care in the ED.  Some ED evaluations and interventions may be delayed as a result of limited staffing during the pandemic. ? ? ? ? ?  ?Nita Sickle, MD ?04/13/22 1027 ? ?

## 2022-08-22 ENCOUNTER — Telehealth: Payer: Self-pay

## 2022-08-22 NOTE — Telephone Encounter (Signed)
Patient called to connect with a Primary Care Provider. Patient has Managed Medicaid. LVM for patient to call [phone] to discuss making an appoint with a Primary Care Provider. If patient returns call, please reach out to Angelli Baruch or Sarah, RN.   

## 2022-08-28 ENCOUNTER — Telehealth: Payer: Self-pay

## 2022-08-28 NOTE — Telephone Encounter (Signed)
Patient called to connect with a Primary Care Provider. Patient has Managed Medicaid. LVM for patient to call [phone] to discuss making an appoint with a Primary Care Provider. If patient returns call, please reach out to Ellarose Brandi or Leslie, RN.   

## 2022-09-10 ENCOUNTER — Telehealth: Payer: Self-pay

## 2022-09-10 NOTE — Telephone Encounter (Signed)
Patient called to connect with a Primary Care Provider. Patient has Managed Medicaid. LVM for patient to call [phone] to discuss making an appoint with a Primary Care Provider. If patient returns call, please reach out to Rhema Boyett or Leslie, RN.   

## 2022-09-18 ENCOUNTER — Ambulatory Visit
Admission: RE | Admit: 2022-09-18 | Discharge: 2022-09-18 | Disposition: A | Discharge status: Home / Self Care | Payer: Medicaid Other | Source: Ambulatory Visit | Attending: Emergency Medicine | Admitting: Emergency Medicine

## 2022-09-18 VITALS — BP 140/78 | HR 102 | Temp 98.2°F | Resp 18 | Ht 59.0 in | Wt 177.0 lb

## 2022-09-18 DIAGNOSIS — R519 Headache, unspecified: Secondary | ICD-10-CM

## 2022-09-18 MED ORDER — KETOROLAC TROMETHAMINE 30 MG/ML IJ SOLN
30.0000 mg | Freq: Once | INTRAMUSCULAR | Status: AC
  Administered 2022-09-18: 30 mg via INTRAMUSCULAR

## 2022-09-18 NOTE — Discharge Instructions (Addendum)
You were given an injection of Toradol today for your headache.    Go to the emergency department if you have persistent or worsening symptoms.    Establish a primary care provider soon as possible.

## 2022-09-18 NOTE — ED Provider Notes (Signed)
Roderic Palau    CSN: [id number] Arrival date & time: 09/18/22  1639      History   Chief Complaint Chief Complaint  Patient presents with   Headache    Entered by patient    HPI DOMINICK ZERTUCHE is a 31 y.o. female.  Patient presents with 4-day history of intermittent headaches.  She reports history of migraine headaches.  Treatment at home with Tylenol.  She also reports mild congestion and occasional cough.  She reports feeling off balance today; No sensation of the room spinning.  No fever, rash, sore throat, shortness of breath, chest pain, vomiting, diarrhea, or other symptoms.  The history is provided by the patient and medical records.    Past Medical History:  Diagnosis Date   Anemia    Asthma    Heart murmur    cardiac US done during last pregnancy    Iron deficiency anemia 03/11/2018   Short interval between pregnancies affecting pregnancy in first trimester, antepartum 08/07/2017   Supervision of high risk pregnancy, antepartum 08/07/2017   Clinic [facility] Prenatal Labs Dating LMP Blood type: A/Positive/-- (08/31 1557)  Genetic Screen Declines Antibody:Negative (08/31 1557) Anatomic [place]  Rubella:  Varicella:   GTT Early:               Third trimester: 105 RPR: Non Reactive (08/31 1557)  Rhogam  not needed HBsAg: Negative (08/31 1557)  TDaP vaccine                       Flu Shot: HIV:   neg Baby Food    Breast/Bottle                       Patient Active Problem List   Diagnosis Date Noted   H/O tubal ligation 03/28/2020   Cognitive challenges 03/27/2020   Iron deficiency anemia 03/11/2018   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   Obesity, unspecified 04/08/2016    Past Surgical History:  Procedure Laterality Date   CESAREAN SECTION     CESAREAN SECTION N/A 04/01/2018   Procedure: CESAREAN SECTION;  Surgeon: Homero Fellers, MD;  Location: [facility] ORS;  Service: Obstetrics;  Laterality: N/A;   CESAREAN SECTION WITH BILATERAL TUBAL LIGATION N/A 06/14/2019    Procedure: CESAREAN SECTION WITH BILATERAL TUBAL LIGATION;  Surgeon: Homero Fellers, MD;  Location: [facility] ORS;  Service: Obstetrics;  Laterality: N/A;   surgery for esophagela atresia as infant      OB History     Gravida  3   Para  3   Term  3   Preterm      AB      Living  3      SAB      IAB      Ectopic      Multiple  0   Live Births  3            Home Medications    Prior to Admission medications   Medication Sig Start Date End Date Taking? Authorizing Provider  clindamycin (CLEOCIN) 300 MG capsule Take 1 capsule (300 mg total) by mouth 3 (three) times daily. 10/25/21   Sharion Balloon, NP  lidocaine (XYLOCAINE) 2 % solution Use as directed 15 mLs in the mouth or throat as needed for mouth pain. Soak a cotton ball in the viscous lidocaine and apply over the tooth 04/13/22   Rudene Re, MD    [facility]  History Family History  Problem Relation Age of Onset   Anemia Mother    Diabetes Maternal Grandfather     Social History Social History   Tobacco Use   Smoking status: Never   Smokeless tobacco: Never  Vaping Use   Vaping Use: Never used  Substance Use Topics   Alcohol use: No   Drug use: No     Allergies   Peppermint oil and Diphenhydramine hcl   Review of Systems Review of Systems  Constitutional:  Negative for chills and fever.  HENT:  Positive for congestion. Negative for ear pain and sore throat.   Respiratory:  Positive for cough. Negative for shortness of breath.   Cardiovascular:  Negative for chest pain and palpitations.  Gastrointestinal:  Negative for abdominal pain, diarrhea and vomiting.  Skin:  Negative for rash.  Neurological:  Positive for light-headedness and headaches. Negative for dizziness, facial asymmetry, speech difficulty, weakness and numbness.  All other systems reviewed and are negative.    Physical Exam Triage Vital Signs ED Triage Vitals  Enc Vitals Group     BP 09/18/22 1646 (!) 140/78      Pulse Rate 09/18/22 1646 (!) 102     Resp 09/18/22 1646 18     Temp 09/18/22 1646 98.2 F (36.8 C)     Temp src --      SpO2 09/18/22 1646 98 %     Weight 09/18/22 1651 177 lb (80.3 kg)     Height 09/18/22 1651 4\' 11"  (1.499 m)     Head Circumference --      Peak Flow --      Pain Score 09/18/22 1648 7     Pain Loc --      Pain Edu? --      Excl. in GC? --    No data found.  Updated Vital Signs BP (!) 140/78   Pulse (!) 102   Temp 98.2 F (36.8 C)   Resp 18   Ht 4\' 11"  (1.499 m)   Wt 177 lb (80.3 kg)   LMP 08/18/2022   SpO2 98%   BMI 35.75 kg/m   Visual Acuity Right Eye Distance:   Left Eye Distance:   Bilateral Distance:    Right Eye Near:   Left Eye Near:    Bilateral Near:     Physical Exam Vitals and nursing note reviewed.  Constitutional:      General: She is not in acute distress.    Appearance: Normal appearance. She is well-developed. She is not ill-appearing.  HENT:     Right Ear: Tympanic membrane normal.     Left Ear: Tympanic membrane normal.     Nose: Nose normal.     Mouth/Throat:     Mouth: Mucous membranes are moist.     Pharynx: Oropharynx is clear.  Eyes:     Pupils: Pupils are equal, round, and reactive to light.  Cardiovascular:     Rate and Rhythm: Normal rate and regular rhythm.     Heart sounds: Normal heart sounds.  Pulmonary:     Effort: Pulmonary effort is normal. No respiratory distress.     Breath sounds: Normal breath sounds.  Musculoskeletal:     Cervical back: Neck supple.  Skin:    General: Skin is warm and dry.  Neurological:     General: No focal deficit present.     Mental Status: She is alert and oriented to person, place, and time.     Sensory:  No sensory deficit.     Motor: No weakness.     Gait: Gait normal.  Psychiatric:        Mood and Affect: Mood normal.        Behavior: Behavior normal.      UC Treatments / Results  Labs (all labs ordered are listed, but only abnormal results are  displayed) Labs Reviewed - No data to display  EKG   Radiology No results found.  Procedures Procedures (including critical care time)  Medications Ordered in UC Medications  ketorolac (TORADOL) 30 MG/ML injection 30 mg (30 mg Intramuscular Given 09/18/22 1717)    Initial Impression / Assessment and Plan / UC Course  I have reviewed the triage vital signs and the nursing notes.  Pertinent labs & imaging results that were available during my care of the patient were reviewed by me and considered in my medical decision making (see chart for details).    Acute headache.  Patient is well-appearing and her exam is reassuring.  Her headaches have been intermittent and improved with Tylenol.  No NSAIDs taken today.  Treating with Toradol.  Cautioned patient not to take additional OTC medications today.  Education provided on headache.  ED precautions discussed.  Patient does not have a PCP and declines assistance with establishing.  She states she has been seen at [facility] in the past and will follow-up there if her symptoms are not improving.  She agrees to plan of care.  Final Clinical Impressions(s) / UC Diagnoses   Final diagnoses:  Acute nonintractable headache, unspecified headache type     Discharge Instructions      You were given an injection of Toradol today for your headache.    Go to the emergency department if you have persistent or worsening symptoms.    Establish a primary care provider soon as possible.         ED Prescriptions   None    I have reviewed the PDMP during this encounter.   Mickie Bail, NP 09/18/22 1723

## 2022-09-18 NOTE — ED Triage Notes (Addendum)
Patient to Urgent Care with complaints of constant headache x4 days. Reports hx of lifelong migraines but they are never usually this bad. States she has been dizzy (describes feeling like she is swaying back and forth), hot and nauseated. Unable to sleep.  Has been taking tylenol with no improvement.

## 2022-09-19 ENCOUNTER — Emergency Department: Payer: Medicaid Other

## 2022-09-19 ENCOUNTER — Encounter: Payer: Self-pay | Admitting: Emergency Medicine

## 2022-09-19 ENCOUNTER — Emergency Department
Admission: EM | Admit: 2022-09-19 | Discharge: 2022-09-19 | Disposition: A | Discharge status: Home / Self Care | Payer: Medicaid Other | Attending: Emergency Medicine | Admitting: Emergency Medicine

## 2022-09-19 ENCOUNTER — Other Ambulatory Visit: Payer: Self-pay

## 2022-09-19 DIAGNOSIS — J45909 Unspecified asthma, uncomplicated: Secondary | ICD-10-CM | POA: Insufficient documentation

## 2022-09-19 DIAGNOSIS — G43109 Migraine with aura, not intractable, without status migrainosus: Secondary | ICD-10-CM | POA: Diagnosis not present

## 2022-09-19 DIAGNOSIS — R519 Headache, unspecified: Secondary | ICD-10-CM | POA: Diagnosis present

## 2022-09-19 DIAGNOSIS — Z20822 Contact with and (suspected) exposure to covid-19: Secondary | ICD-10-CM | POA: Diagnosis not present

## 2022-09-19 LAB — CBC WITH DIFFERENTIAL/PLATELET
Abs Immature Granulocytes: 0.05 10*3/uL (ref 0.00–0.07)
Basophils Absolute: 0 10*3/uL (ref 0.0–0.1)
Basophils Relative: 0 %
Eosinophils Absolute: 0.2 10*3/uL (ref 0.0–0.5)
Eosinophils Relative: 2 %
HCT: 37.8 % (ref 36.0–46.0)
Hemoglobin: 11.6 g/dL — ABNORMAL LOW (ref 12.0–15.0)
Immature Granulocytes: 0 %
Lymphocytes Relative: 21 %
Lymphs Abs: 2.9 10*3/uL (ref 0.7–4.0)
MCH: 23.8 pg — ABNORMAL LOW (ref 26.0–34.0)
MCHC: 30.7 g/dL (ref 30.0–36.0)
MCV: 77.6 fL — ABNORMAL LOW (ref 80.0–100.0)
Monocytes Absolute: 0.7 10*3/uL (ref 0.1–1.0)
Monocytes Relative: 5 %
Neutro Abs: 9.8 10*3/uL — ABNORMAL HIGH (ref 1.7–7.7)
Neutrophils Relative %: 72 %
Platelets: 364 10*3/uL (ref 150–400)
RBC: 4.87 MIL/uL (ref 3.87–5.11)
RDW: 14.4 % (ref 11.5–15.5)
WBC: 13.8 10*3/uL — ABNORMAL HIGH (ref 4.0–10.5)
nRBC: 0 % (ref 0.0–0.2)

## 2022-09-19 LAB — COMPREHENSIVE METABOLIC PANEL
ALT: 14 U/L (ref 0–44)
AST: 21 U/L (ref 15–41)
Albumin: 3.2 g/dL — ABNORMAL LOW (ref 3.5–5.0)
Alkaline Phosphatase: 82 U/L (ref 38–126)
Anion gap: 6 (ref 5–15)
BUN: 12 mg/dL (ref 6–20)
CO2: 22 mmol/L (ref 22–32)
Calcium: 8.2 mg/dL — ABNORMAL LOW (ref 8.9–10.3)
Chloride: 111 mmol/L (ref 98–111)
Creatinine, Ser: 0.75 mg/dL (ref 0.44–1.00)
GFR, Estimated: >60 mL/min (ref >60)
Glucose, Bld: 103 mg/dL — ABNORMAL HIGH (ref 70–99)
Potassium: 4.5 mmol/L (ref 3.5–5.1)
Sodium: 139 mmol/L (ref 135–145)
Total Bilirubin: 0.9 mg/dL (ref 0.3–1.2)
Total Protein: 6.2 g/dL — ABNORMAL LOW (ref 6.5–8.1)

## 2022-09-19 LAB — URINALYSIS, ROUTINE W REFLEX MICROSCOPIC
Bacteria, UA: NONE SEEN
Bilirubin Urine: NEGATIVE
Glucose, UA: NEGATIVE mg/dL
Ketones, ur: NEGATIVE mg/dL
Leukocytes,Ua: NEGATIVE
Nitrite: NEGATIVE
Protein, ur: NEGATIVE mg/dL
Specific Gravity, Urine: 1.008 (ref 1.005–1.030)
pH: 5 (ref 5.0–8.0)

## 2022-09-19 LAB — RESP PANEL BY RT-PCR (FLU A&B, COVID) ARPGX2
Influenza A by PCR: NEGATIVE
Influenza B by PCR: NEGATIVE
SARS Coronavirus 2 by RT PCR: NEGATIVE

## 2022-09-19 MED ORDER — IOHEXOL 350 MG/ML SOLN
75.0000 mL | Freq: Once | INTRAVENOUS | Status: AC | PRN
  Administered 2022-09-19: 75 mL via INTRAVENOUS

## 2022-09-19 MED ORDER — METOCLOPRAMIDE HCL 5 MG/ML IJ SOLN
10.0000 mg | Freq: Once | INTRAMUSCULAR | Status: AC
Start: 2022-09-19 — End: 2022-09-19
  Administered 2022-09-19: 10 mg via INTRAVENOUS
  Filled 2022-09-19: qty 2

## 2022-09-19 MED ORDER — DEXAMETHASONE SODIUM PHOSPHATE 10 MG/ML IJ SOLN
10.0000 mg | Freq: Once | INTRAMUSCULAR | Status: AC
  Administered 2022-09-19: 10 mg via INTRAVENOUS
  Filled 2022-09-19: qty 1

## 2022-09-19 MED ORDER — SODIUM CHLORIDE 0.9 % IV BOLUS
1000.0000 mL | Freq: Once | INTRAVENOUS | Status: AC
  Administered 2022-09-19: 1000 mL via INTRAVENOUS

## 2022-09-19 MED ORDER — KETOROLAC TROMETHAMINE 30 MG/ML IJ SOLN
30.0000 mg | Freq: Once | INTRAMUSCULAR | Status: DC
  Filled 2022-09-19: qty 1

## 2022-09-19 NOTE — ED Provider Notes (Signed)
[facility] Provider Note  Patient Contact: 8:10 PM (approximate)   History   Migraine   HPI  Lori Velasquez is a 31 y.o. female with a history of anemia, asthma and iron deficiency anemia, presents to the emergency department with a frontal headache that has occurred since Tuesday.  Patient reports that headache came on suddenly on Tuesday night before bed.  Patient also noticed some associated neck stiffness.  She states that she has never had a headache occur before bed.  Patient states that she typically has a severe migraine every couple years that requires migraine cocktail.  She has had some blurry vision and photophobia.  She endorses some nausea at home patient was seen at urgent care yesterday and received an injection of Toradol with little improvement in her symptoms.  Denies fever and chills.  She states that she has been taking over-the-counter medication for nasal congestion.  No rhinorrhea or cough.      Physical Exam   Triage Vital Signs: ED Triage Vitals  Enc Vitals Group     BP 09/19/22 1556 110/85     Pulse Rate 09/19/22 1556 77     Resp 09/19/22 1556 20     Temp 09/19/22 1556 98.2 F (36.8 C)     Temp Source 09/19/22 1556 Oral     SpO2 09/19/22 1556 100 %     Weight 09/19/22 1557 177 lb (80.3 kg)     Height 09/19/22 1557 4\' 11"  (1.499 m)     Head Circumference --      Peak Flow --      Pain Score 09/19/22 1559 6     Pain Loc --      Pain Edu? --      Excl. in GC? --     Most recent vital signs: Vitals:   09/19/22 2015 09/19/22 2205  BP: 128/81 126/78  Pulse: 75 77  Resp:  17  Temp:  98.3 F (36.8 C)  SpO2:  100%     General: Alert and in no acute distress. Eyes:  PERRL. EOMI. Head: No acute traumatic findings ENT:      Nose: No congestion/rhinnorhea.      Mouth/Throat: Mucous membranes are moist. Neck: No stridor. No cervical spine tenderness to palpation.  Patient has neck stiffness with range of motion.  Negative  Kernig and Brudzinski. Cardiovascular:  Good peripheral perfusion Respiratory: Normal respiratory effort without tachypnea or retractions. Lungs CTAB. Good air entry to the bases with no decreased or absent breath sounds. Gastrointestinal: Bowel sounds 4 quadrants. Soft and nontender to palpation. No guarding or rigidity. No palpable masses. No distention. No CVA tenderness. Musculoskeletal: Full range of motion to all extremities.  Neurologic:  No gross focal neurologic deficits are appreciated.  Skin:   No rash noted Other:   ED Results / Procedures / Treatments   Labs (all labs ordered are listed, but only abnormal results are displayed) Labs Reviewed  CBC WITH DIFFERENTIAL/PLATELET - Abnormal; Notable for the following components:      Result Value   WBC 13.8 (*)    Hemoglobin 11.6 (*)    MCV 77.6 (*)    MCH 23.8 (*)    Neutro Abs 9.8 (*)    All other components within normal limits  URINALYSIS, ROUTINE W REFLEX MICROSCOPIC - Abnormal; Notable for the following components:   Color, Urine STRAW (*)    APPearance CLEAR (*)    Hgb urine dipstick SMALL (*)  All other components within normal limits  COMPREHENSIVE METABOLIC PANEL - Abnormal; Notable for the following components:   Glucose, Bld 103 (*)    Calcium 8.2 (*)    Total Protein 6.2 (*)    Albumin 3.2 (*)    All other components within normal limits  RESP PANEL BY RT-PCR (FLU A&B, COVID) ARPGX2        RADIOLOGY  I personally viewed and evaluated these images as part of my medical decision making, as well as reviewing the written report by the radiologist.  ED Provider Interpretation:    PROCEDURES:  Critical Care performed: No  Procedures   MEDICATIONS ORDERED IN ED: Medications  metoCLOPramide (REGLAN) injection 10 mg (10 mg Intravenous Given 09/19/22 2029)  sodium chloride 0.9 % bolus 1,000 mL (0 mLs Intravenous Stopped 09/19/22 2204)  dexamethasone (DECADRON) injection 10 mg (10 mg Intravenous  Given 09/19/22 2029)  iohexol (OMNIPAQUE) 350 MG/ML injection 75 mL (75 mLs Intravenous Contrast Given 09/19/22 2220)     IMPRESSION / MDM / [place] / ED COURSE  I reviewed the triage vital signs and the nursing notes.                              Assessment and plan Headache 31 year old female presents to the emergency department with severe frontal headache that occurred suddenly on Tuesday night before bed with associated neck stiffness.  Vital signs are reassuring at triage.  On exam, patient was alert and nontoxic-appearing.  She had no neurodeficits noted.  She is wearing sunglasses due to photophobia.  Differential diagnosis includes intracranial bleed, subarachnoid hemorrhage, migraine..    CT head without contrast and CT angio head unremarkable.  Patient stated that her headache completely resolved after Reglan, Decadron and supplemental fluids.  Return precautions were given to return with new or worsening symptoms.     FINAL CLINICAL IMPRESSION(S) / ED DIAGNOSES   Final diagnoses:  Migraine with aura and without status migrainosus, not intractable     Rx / DC Orders   ED Discharge Orders     None        Note:  This document was prepared using Dragon voice recognition software and may include unintentional dictation errors.   Vallarie Mare [place], PA-C 09/19/22 2253    Duffy Bruce, MD 09/27/22 [phone]

## 2022-09-19 NOTE — ED Triage Notes (Signed)
Pt to ED via POV for migraine sinus Tuesday. Pt seen at [facility] yesterday and given shot but it has not helped. Pt is in NAD.

## 2022-09-19 NOTE — ED Provider Triage Note (Signed)
  Emergency Medicine Provider Triage Evaluation Note  DIMA MINI , a 30 y.o.female,  was evaluated in triage.  Pt complains of headache.  Patient states that she has had a migraine since Tuesday that has progressively worsened.  She states that does not feel like her normal migraines.  She was seen at urgent care yesterday, who gave her a shot of something that started with a "D", however it has not helped.  Current endorsing photophobia at this time.   Review of Systems  Positive: Headache, photophobia Negative: Denies fever, chest pain, vomiting  Physical Exam   Vitals:   09/19/22 1556  BP: 110/85  Pulse: 77  Resp: 20  Temp: 98.2 F (36.8 C)  SpO2: 100%   Gen:   Awake, appears uncomfortable. Resp:  Normal effort  MSK:   Moves extremities without difficulty  Other:    Medical Decision Making  Given the patient's initial medical screening exam, the following diagnostic evaluation has been ordered. The patient will be placed in the appropriate treatment space, once one is available, to complete the evaluation and treatment. I have discussed the plan of care with the patient and I have advised the patient that an ED physician or mid-level practitioner will reevaluate their condition after the test results have been received, as the results may give them additional insight into the type of treatment they may need.    Diagnostics:   Treatments: none immediately   Teodoro Spray, [place] 09/19/22 1622

## 2022-09-30 ENCOUNTER — Ambulatory Visit
Admission: EM | Admit: 2022-09-30 | Discharge: 2022-09-30 | Disposition: A | Discharge status: Home / Self Care | Payer: Medicaid Other | Attending: Urgent Care | Admitting: Urgent Care

## 2022-09-30 DIAGNOSIS — B9689 Other specified bacterial agents as the cause of diseases classified elsewhere: Secondary | ICD-10-CM

## 2022-09-30 DIAGNOSIS — J028 Acute pharyngitis due to other specified organisms: Secondary | ICD-10-CM

## 2022-09-30 LAB — POCT RAPID STREP A (OFFICE): Rapid Strep A Screen: POSITIVE — AB

## 2022-09-30 MED ORDER — AMOXICILLIN 500 MG PO CAPS: 500.0000 mg | ORAL_CAPSULE | Freq: Two times a day (BID) | ORAL | 0 refills | Status: AC

## 2022-09-30 NOTE — ED Triage Notes (Signed)
Pt. Presents to UC w/ c/o a sore throat for the past two days. Pt. Expresses concern for strep.

## 2022-09-30 NOTE — ED Provider Notes (Signed)
Roderic Palau    CSN: [id number] Arrival date & time: 09/30/22  1557      History   Chief Complaint Chief Complaint  Patient presents with   Sore Throat    Entered by patient    HPI Lori Velasquez is a 31 y.o. female.    Sore Throat    Presents to UC with concern for strep infection.  She endorses sore throat x2 days.  Past Medical History:  Diagnosis Date   Anemia    Asthma    Heart murmur    cardiac US done during last pregnancy    Iron deficiency anemia 03/11/2018   Short interval between pregnancies affecting pregnancy in first trimester, antepartum 08/07/2017   Supervision of high risk pregnancy, antepartum 08/07/2017   Clinic [facility] Prenatal Labs Dating LMP Blood type: A/Positive/-- (08/31 1557)  Genetic Screen Declines Antibody:Negative (08/31 1557) Anatomic [place]  Rubella:  Varicella:   GTT Early:               Third trimester: 105 RPR: Non Reactive (08/31 1557)  Rhogam  not needed HBsAg: Negative (08/31 1557)  TDaP vaccine                       Flu Shot: HIV:   neg Baby Food    Breast/Bottle                       Patient Active Problem List   Diagnosis Date Noted   H/O tubal ligation 03/28/2020   Cognitive challenges 03/27/2020   Iron deficiency anemia 03/11/2018   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   Obesity, unspecified 04/08/2016    Past Surgical History:  Procedure Laterality Date   CESAREAN SECTION     CESAREAN SECTION N/A 04/01/2018   Procedure: CESAREAN SECTION;  Surgeon: Homero Fellers, MD;  Location: [facility] ORS;  Service: Obstetrics;  Laterality: N/A;   CESAREAN SECTION WITH BILATERAL TUBAL LIGATION N/A 06/14/2019   Procedure: CESAREAN SECTION WITH BILATERAL TUBAL LIGATION;  Surgeon: Homero Fellers, MD;  Location: [facility] ORS;  Service: Obstetrics;  Laterality: N/A;   surgery for esophagela atresia as infant      OB History     Gravida  3   Para  3   Term  3   Preterm      AB      Living  3      SAB      IAB       Ectopic      Multiple  0   Live Births  3            Home Medications    Prior to Admission medications   Medication Sig Start Date End Date Taking? Authorizing Provider  clindamycin (CLEOCIN) 300 MG capsule Take 1 capsule (300 mg total) by mouth 3 (three) times daily. 10/25/21   Sharion Balloon, NP  lidocaine (XYLOCAINE) 2 % solution Use as directed 15 mLs in the mouth or throat as needed for mouth pain. Soak a cotton ball in the viscous lidocaine and apply over the tooth 04/13/22   Rudene Re, MD    Family History Family History  Problem Relation Age of Onset   Anemia Mother    Diabetes Maternal Grandfather     Social History Social History   Tobacco Use   Smoking status: Never   Smokeless tobacco: Never  Vaping Use   Vaping Use: Never  used  Substance Use Topics   Alcohol use: No   Drug use: No     Allergies   Peppermint oil and Diphenhydramine hcl   Review of Systems Review of Systems   Physical Exam Triage Vital Signs ED Triage Vitals [09/30/22 1628]  Enc Vitals Group     BP 112/67     Pulse Rate (!) 101     Resp 18     Temp 98.1 F (36.7 C)     Temp src      SpO2 97 %     Weight      Height      Head Circumference      Peak Flow      Pain Score 4     Pain Loc      Pain Edu?      Excl. in GC?    No data found.  Updated Vital Signs BP 112/67   Pulse (!) 101   Temp 98.1 F (36.7 C)   Resp 18   LMP  (LMP Unknown)   SpO2 97%   Visual Acuity Right Eye Distance:   Left Eye Distance:   Bilateral Distance:    Right Eye Near:   Left Eye Near:    Bilateral Near:     Physical Exam Vitals reviewed.  Constitutional:      Appearance: She is well-developed.  HENT:     Mouth/Throat:     Mouth: Mucous membranes are moist.     Pharynx: Posterior oropharyngeal erythema present. No oropharyngeal exudate.     Comments: Unable to observe her pharynx and tonsils due to obstruction by her tongue. Skin:    General: Skin is warm  and dry.  Neurological:     General: No focal deficit present.     Mental Status: She is alert and oriented to person, place, and time.  Psychiatric:        Mood and Affect: Mood normal.        Behavior: Behavior normal.      UC Treatments / Results  Labs (all labs ordered are listed, but only abnormal results are displayed) Labs Reviewed - No data to display  EKG   Radiology No results found.  Procedures Procedures (including critical care time)  Medications Ordered in UC Medications - No data to display  Initial Impression / Assessment and Plan / UC Course  I have reviewed the triage vital signs and the nursing notes.  Pertinent labs & imaging results that were available during my care of the patient were reviewed by me and considered in my medical decision making (see chart for details).   Rapid strep is positive.  Will treat with amoxicillin twice daily x10 days.   Final Clinical Impressions(s) / UC Diagnoses   Final diagnoses:  None   Discharge Instructions   None    ED Prescriptions   None    PDMP not reviewed this encounter.   Charma Igo, [place] 09/30/22 1645

## 2022-09-30 NOTE — Discharge Instructions (Addendum)
Follow up here or with your primary care provider if your symptoms are worsening or not improving with treatment.     

## 2024-09-13 ENCOUNTER — Ambulatory Visit
Admission: RE | Admit: 2024-09-13 | Discharge: 2024-09-13 | Disposition: A | Discharge status: Home / Self Care | Source: Ambulatory Visit | Attending: Emergency Medicine | Admitting: Emergency Medicine

## 2024-09-13 VITALS — BP 117/77 | HR 93 | Temp 98.7°F | Resp 16 | Ht 59.0 in | Wt 191.0 lb

## 2024-09-13 DIAGNOSIS — N61 Mastitis without abscess: Secondary | ICD-10-CM | POA: Diagnosis not present

## 2024-09-13 DIAGNOSIS — B372 Candidiasis of skin and nail: Secondary | ICD-10-CM

## 2024-09-13 MED ORDER — CLOTRIMAZOLE 1 % EX CREA: TOPICAL_CREAM | CUTANEOUS | 0 refills | Status: AC

## 2024-09-13 MED ORDER — AMOXICILLIN-POT CLAVULANATE 875-125 MG PO TABS: 1.0000 | ORAL_TABLET | Freq: Two times a day (BID) | ORAL | 0 refills | Status: AC

## 2024-09-13 NOTE — Discharge Instructions (Addendum)
 Take augmentin as prescribed Apply lotrimin as prescribed to affected areas every 12 hours Avoid heat,hot water as it makes rashes worse. Make sure to pat area dry after bathing or cool hair dryer setting to remove excess moisture

## 2024-09-13 NOTE — ED Provider Notes (Signed)
 [facility]    CSN: [id number] Arrival date & time: 09/13/24  1118      History   Chief Complaint Chief Complaint  Patient presents with   Breast Problem   Rash    HPI Lori Velasquez is a 33 y.o. female.   33 year old female, Lori Velasquez, presents  to urgetn care for evaluation of left breast rash x 1 month. Pt states area is tender,red and irritated, has tried OTC meds without relief.  Patient denies any new lotions or soaps or insect bites, no new meds.  The history is provided by the patient. No language interpreter was used.    Past Medical History:  Diagnosis Date   Anemia    Asthma    Heart murmur    cardiac US  done during last pregnancy    Iron  deficiency anemia 03/11/2018   Short interval between pregnancies affecting pregnancy in first trimester, antepartum 08/07/2017   Supervision of high risk pregnancy, antepartum 08/07/2017   Clinic [facility] Prenatal Labs Dating LMP Blood type: A/Positive/-- (08/31 1557)  Genetic Screen Declines Antibody:Negative (08/31 1557) Anatomic US   Rubella:  Varicella:   GTT Early:               Third trimester: 105 RPR: Non Reactive (08/31 1557)  Rhogam  not needed HBsAg: Negative (08/31 1557)  TDaP vaccine                       Flu Shot: HIV:   neg Baby Food    Breast/Bottle                       Patient Active Problem List   Diagnosis Date Noted   Yeast infection of the skin 09/13/2024   Mastitis 09/13/2024   H/O tubal ligation 03/28/2020   Cognitive challenges 03/27/2020   Iron  deficiency anemia 03/11/2018   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   Obesity, unspecified 04/08/2016    Past Surgical History:  Procedure Laterality Date   CESAREAN SECTION     CESAREAN SECTION N/A 04/01/2018   Procedure: CESAREAN SECTION;  Surgeon: Victor Claudell SAUNDERS, MD;  Location: [facility];  Service: Obstetrics;  Laterality: N/A;   CESAREAN SECTION WITH BILATERAL TUBAL LIGATION N/A 06/14/2019   Procedure: CESAREAN SECTION WITH  BILATERAL TUBAL LIGATION;  Surgeon: Victor Claudell SAUNDERS, MD;  Location: [facility];  Service: Obstetrics;  Laterality: N/A;   surgery for esophagela atresia as infant      OB History     Gravida  3   Para  3   Term  3   Preterm      AB      Living  3      SAB      IAB      Ectopic      Multiple  0   Live Births  3            Home Medications    Prior to Admission medications   Medication Sig Start Date End Date Taking? Authorizing Provider  amoxicillin -clavulanate (AUGMENTIN) 875-125 MG tablet Take 1 tablet by mouth every 12 (twelve) hours. 09/13/24  Yes Venera Privott, NP  clotrimazole (LOTRIMIN) 1 % cream Apply to affected area 2 times daily x 3 weeks 09/13/24  Yes Calton Harshfield, Rilla, NP  lidocaine  (XYLOCAINE ) 2 % solution Use as directed 15 mLs in the mouth or throat as needed for mouth pain. Soak a cotton ball  in the viscous lidocaine  and apply over the tooth 04/13/22   Edelmiro Leash, MD    Family History Family History  Problem Relation Age of Onset   Anemia Mother    Diabetes Maternal Grandfather     Social History Social History   Tobacco Use   Smoking status: Never   Smokeless tobacco: Never  Vaping Use   Vaping status: Never Used  Substance Use Topics   Alcohol use: No   Drug use: No     Allergies   Peppermint oil and Diphenhydramine  hcl   Review of Systems Review of Systems  Constitutional:  Negative for fever.  Skin:  Positive for color change and rash.  All other systems reviewed and are negative.    Physical Exam Triage Vital Signs ED Triage Vitals  Encounter Vitals Group     BP      Girls Systolic BP Percentile      Girls Diastolic BP Percentile      Boys Systolic BP Percentile      Boys Diastolic BP Percentile      Pulse      Resp      Temp      Temp src      SpO2      Weight      Height      Head Circumference      Peak Flow      Pain Score      Pain Loc      Pain Education      Exclude from Growth  Chart    No data found.  Updated Vital Signs BP 117/77 (BP Location: Right Arm)   Pulse 93   Temp 98.7 F (37.1 C) (Oral)   Resp 16   Ht 4' 11 (1.499 m)   Wt 191 lb (86.6 kg)   SpO2 98%   BMI 38.58 kg/m   Visual Acuity Right Eye Distance:   Left Eye Distance:   Bilateral Distance:    Right Eye Near:   Left Eye Near:    Bilateral Near:     Physical Exam Vitals and nursing note reviewed. Exam conducted with a chaperone present.  Chest:       Comments: Area of excoriated rash noted consistent with yeast, satellite lesion, no pustules, erythema of left breast Neurological:     General: No focal deficit present.     Mental Status: She is alert and oriented to person, place, and time.     GCS: GCS eye subscore is 4. GCS verbal subscore is 5. GCS motor subscore is 6.  Psychiatric:        Attention and Perception: Attention normal.        Mood and Affect: Mood normal.        Speech: Speech normal.        Behavior: Behavior normal.      UC Treatments / Results  Labs (all labs ordered are listed, but only abnormal results are displayed) Labs Reviewed - No data to display  EKG   Radiology No results found.  Procedures Procedures (including critical care time)  Medications Ordered in UC Medications - No data to display  Initial Impression / Assessment and Plan / UC Course  I have reviewed the triage vital signs and the nursing notes.  Pertinent labs & imaging results that were available during my care of the patient were reviewed by me and considered in my medical decision making (see chart for details).  Discussed exam findings and plan of care with patient ,Augmentin and Lotrimin scripted, strict go to Er precautions given.  Patient verbalized understanding of this provider.  Ddx: Yeast infection,mastitis, dermatitis, allergies Final Clinical Impressions(s) / UC Diagnoses   Final diagnoses:  Mastitis  Yeast infection of the skin     Discharge  Instructions      Take augmentin as prescribed Apply lotrimin as prescribed to affected areas every 12 hours Avoid heat,hot water as it makes rashes worse. Make sure to pat area dry after bathing or cool hair dryer setting to remove excess moisture     ED Prescriptions     Medication Sig Dispense Auth. Provider   amoxicillin -clavulanate (AUGMENTIN) 875-125 MG tablet Take 1 tablet by mouth every 12 (twelve) hours. 14 tablet Niasha Devins, NP   clotrimazole (LOTRIMIN) 1 % cream Apply to affected area 2 times daily x 3 weeks 28 g Gustave Lindeman, NP      PDMP not reviewed this encounter.   Aminta Loose, NP 09/13/24 1921

## 2024-09-13 NOTE — ED Triage Notes (Signed)
 Pt c/o rash on L side of breast x1 mon. States tender,red & irritated. Has tried OTC meds w/o relief.

## 2024-09-22 ENCOUNTER — Telehealth: Payer: Self-pay

## 2024-09-22 NOTE — Telephone Encounter (Signed)
 Patient called and stated she needs to schedule a mammogram has left breast rash. Patient went to urgent care on 09/13/2024, was told has mastitis of left breast and yeast infection on skin. Patient was prescribed augmentin and Clotramizole, would like to schedule a mammogram. Patient has active Medicaid [facility]-verified per [employer]). Patient informed [facility] program is for uninsured women and because she has medicaid needs to contact pcp or OB/gyn. Patient stated she is not currently lactating but will contact her OB/GYN, seek a pcp if needed.

## 2024-10-19 ENCOUNTER — Encounter: Payer: Self-pay | Admitting: Registered Nurse

## 2024-10-19 ENCOUNTER — Ambulatory Visit: Admitting: Registered Nurse

## 2024-10-19 ENCOUNTER — Other Ambulatory Visit (HOSPITAL_COMMUNITY)
Admission: RE | Admit: 2024-10-19 | Discharge: 2024-10-19 | Disposition: A | Source: Ambulatory Visit | Attending: Registered Nurse | Admitting: Registered Nurse

## 2024-10-19 VITALS — BP 113/52 | HR 71 | Ht 59.0 in | Wt 192.0 lb

## 2024-10-19 DIAGNOSIS — Z01419 Encounter for gynecological examination (general) (routine) without abnormal findings: Secondary | ICD-10-CM

## 2024-10-19 DIAGNOSIS — Z124 Encounter for screening for malignant neoplasm of cervix: Secondary | ICD-10-CM

## 2024-10-19 DIAGNOSIS — E6609 Other obesity due to excess calories: Secondary | ICD-10-CM

## 2024-10-19 DIAGNOSIS — Z0001 Encounter for general adult medical examination with abnormal findings: Secondary | ICD-10-CM

## 2024-10-19 DIAGNOSIS — Z130 Encounter for screening for diseases of the blood and blood-forming organs and certain disorders involving the immune mechanism: Secondary | ICD-10-CM

## 2024-10-19 DIAGNOSIS — B369 Superficial mycosis, unspecified: Secondary | ICD-10-CM

## 2024-10-19 MED ORDER — LOTRIMIN AF 2 % EX AERO: 1.0000 | INHALATION_SPRAY | Freq: Every day | CUTANEOUS | 10 refills | Status: AC

## 2024-10-19 NOTE — Progress Notes (Unsigned)
 ANNUAL GYNECOLOGICAL EXAM  HPI  Lori Velasquez is a 33 y.o.-year-old [id number] who presents for an annual gynecological exam today.  She denies pelvic pain, dyspareunia, abnormal vaginal bleeding or discharge, and UTI symptoms. Reports a red rash with pain on left breast that comes and goes needs a referral for mammogram   Medical/Surgical History Past Medical History:  Diagnosis Date   Anemia    Asthma    Heart murmur    cardiac US  done during last pregnancy    Iron  deficiency anemia 03/11/2018   Short interval between pregnancies affecting pregnancy in first trimester, antepartum 08/07/2017   Supervision of high risk pregnancy, antepartum 08/07/2017   Clinic [facility] Prenatal Labs Dating LMP Blood type: A/Positive/-- (08/31 1557)  Genetic Screen Declines Antibody:Negative (08/31 1557) Anatomic US   Rubella:  Varicella:   GTT Early:               Third trimester: 105 RPR: Non Reactive (08/31 1557)  Rhogam  not needed HBsAg: Negative (08/31 1557)  TDaP vaccine                       Flu Shot: HIV:   neg Baby Food    Breast/Bottle                      Past Surgical History:  Procedure Laterality Date   CESAREAN SECTION     CESAREAN SECTION N/A 04/01/2018   Procedure: CESAREAN SECTION;  Surgeon: Victor Claudell SAUNDERS, MD;  Location: [facility];  Service: Obstetrics;  Laterality: N/A;   CESAREAN SECTION WITH BILATERAL TUBAL LIGATION N/A 06/14/2019   Procedure: CESAREAN SECTION WITH BILATERAL TUBAL LIGATION;  Surgeon: Victor Claudell SAUNDERS, MD;  Location: [facility];  Service: Obstetrics;  Laterality: N/A;   surgery for esophagela atresia as infant      Social History Lives with husband and kids . 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 {Regular/irregular menstrual period abdominal pain hpi [id number]} Last Pap:  Contraception:  Prevention Dentist Eye exam Mammogram Colonoscopy Flu shot/vaccines  Current Medications Outpatient Medications Prior to Visit  Medication Sig   amoxicillin -clavulanate (AUGMENTIN) 875-125 MG tablet Take 1 tablet by mouth every 12 (twelve) hours. (Patient not taking: Reported on 10/19/2024)   clotrimazole (LOTRIMIN) 1 % cream Apply to affected area 2 times daily x 3 weeks (Patient not taking: Reported on 10/19/2024)   lidocaine  (XYLOCAINE ) 2 % solution Use as directed 15 mLs in the mouth or throat as needed for mouth pain. Soak a cotton ball in the viscous lidocaine  and apply over the tooth (Patient not taking: Reported on 10/19/2024)   No facility-administered medications prior to visit.        ROS Constitutional: Denied constitutional symptoms, night sweats, recent illness, fatigue, fever, insomnia and weight loss.  Eyes: Denied eye symptoms, eye pain, photophobia, vision change and  visual disturbance.  Ears/Nose/Throat/Neck: Denied ear, nose, throat or neck symptoms, hearing loss, nasal discharge, sinus congestion and sore throat.  Cardiovascular: Denied cardiovascular symptoms, arrhythmia, chest pain/pressure, edema, exercise intolerance, orthopnea and palpitations.  Respiratory: Denied pulmonary symptoms, asthma, pleuritic pain, productive sputum, cough, dyspnea and wheezing.  Gastrointestinal: Denied gastro-esophageal reflux, melena, nausea and vomiting.  Genitourinary:*** Denied genitourinary symptoms including symptomatic vaginal discharge, pelvic relaxation issues, and urinary complaints.  Musculoskeletal: Denied musculoskeletal symptoms, stiffness, swelling, muscle weakness and myalgia.  Dermatologic: Denied dermatology symptoms, rash and scar.  Neurologic: Denied neurology symptoms,  dizziness, headache, neck pain and syncope.  Psychiatric: Denied psychiatric symptoms, anxiety and depression.  Endocrine: Denied endocrine symptoms including hot flashes and night sweats.    OBJECTIVE  BP (!) 113/52   Pulse 71   Ht 4' 11 (1.499 m)   Wt 192 lb (87.1 kg)   LMP 10/16/2024   BMI 38.78 kg/m    Physical examination General NAD, Conversant  HEENT Atraumatic; Op clear with mmm.  Normo-cephalic. Pupils reactive. Anicteric sclerae  Thyroid/Neck Smooth without nodularity or enlargement. Normal ROM.  Neck Supple.  Skin No rashes, lesions or ulceration. Normal palpated skin turgor. No nodularity.  Breasts: No masses or discharge.  Symmetric.  No axillary adenopathy.  Lungs: Clear to auscultation.No rales or wheezes. Normal Respiratory effort, no retractions.  Heart: NSR.  No murmurs or rubs appreciated. No peripheral edema  Abdomen: Soft.  Non-tender.  No masses.  No HSM. No hernia  Extremities: Moves all appropriately.  Normal ROM for age. No lymphadenopathy.  Neuro: Oriented to PPT.  Normal mood. Normal affect.     Pelvic:   Vulva: Normal appearance.  No lesions.  Vagina: No lesions or abnormalities noted.  Support: Normal pelvic support.  Urethra No masses tenderness or scarring.  Meatus Normal size without lesions or prolapse.  Cervix: Normal appearance.  No lesions.  Anus: Normal exam.  No lesions.  Perineum: Normal exam.  No lesions.        Bimanual   Uterus: Normal size.  Non-tender.  Mobile.  AV.  Adnexae: No masses.  Non-tender to palpation.  Cul-de-sac: Negative for abnormality.    ASSESSMENT  1) Annual exam  PLAN 1) Physical exam as noted. Discussed healthy lifestyle choices and preventive care. 2) *** 3) Return in one year for annual exam or as needed for concerns.   Lauraine Lakes, CNM

## 2024-10-19 NOTE — Patient Instructions (Signed)

## 2024-10-20 ENCOUNTER — Ambulatory Visit: Payer: Self-pay | Admitting: Registered Nurse

## 2024-10-20 LAB — HEMOGLOBIN A1C
Est. average glucose Bld gHb Est-mCnc: 117 mg/dL
Hgb A1c MFr Bld: 5.7 % — ABNORMAL HIGH (ref 4.8–5.6)

## 2024-10-20 LAB — CBC
Hematocrit: 38.7 % (ref 34.0–46.6)
Hemoglobin: 11.8 g/dL (ref 11.1–15.9)
MCH: 25 pg — ABNORMAL LOW (ref 26.6–33.0)
MCHC: 30.5 g/dL — ABNORMAL LOW (ref 31.5–35.7)
MCV: 82 fL (ref 79–97)
Platelets: 353 x10E3/uL (ref 150–450)
RBC: 4.72 x10E6/uL (ref 3.77–5.28)
RDW: 13.4 % (ref 11.7–15.4)
WBC: 8.1 x10E3/uL (ref 3.4–10.8)

## 2024-10-20 LAB — COMPREHENSIVE METABOLIC PANEL WITH GFR
ALT: 36 IU/L — ABNORMAL HIGH (ref 0–32)
AST: 28 IU/L (ref 0–40)
Albumin: 4.4 g/dL (ref 3.9–4.9)
Alkaline Phosphatase: 119 IU/L — ABNORMAL HIGH (ref 41–116)
BUN/Creatinine Ratio: 17 (ref 9–23)
BUN: 10 mg/dL (ref 6–20)
Bilirubin Total: 0.4 mg/dL (ref 0.0–1.2)
CO2: 24 mmol/L (ref 20–29)
Calcium: 9.7 mg/dL (ref 8.7–10.2)
Chloride: 100 mmol/L (ref 96–106)
Creatinine, Ser: 0.59 mg/dL (ref 0.57–1.00)
Globulin, Total: 2.5 g/dL (ref 1.5–4.5)
Glucose: 84 mg/dL (ref 70–99)
Potassium: 4.4 mmol/L (ref 3.5–5.2)
Sodium: 139 mmol/L (ref 134–144)
Total Protein: 6.9 g/dL (ref 6.0–8.5)
eGFR: 123 mL/min/1.73 (ref >59)

## 2024-10-20 LAB — TSH: TSH: 4.21 u[IU]/mL (ref 0.450–4.500)

## 2024-10-22 LAB — CYTOLOGY - PAP
Adequacy: ABSENT
Comment: NEGATIVE
Diagnosis: NEGATIVE
High risk HPV: NEGATIVE

## 2024-11-10 ENCOUNTER — Ambulatory Visit
